# Patient Record
Sex: Female | Born: 1960 | Race: White | Hispanic: No | Marital: Married | Smoking: Current every day smoker
Health system: Southern US, Community
[De-identification: ages and names within clinical notes are randomized; demographics above are authoritative.]

## PROBLEM LIST (undated history)

## (undated) DIAGNOSIS — E785 Hyperlipidemia, unspecified: Secondary | ICD-10-CM

## (undated) DIAGNOSIS — E559 Vitamin D deficiency, unspecified: Secondary | ICD-10-CM

## (undated) DIAGNOSIS — I639 Cerebral infarction, unspecified: Secondary | ICD-10-CM

## (undated) DIAGNOSIS — T8859XA Other complications of anesthesia, initial encounter: Secondary | ICD-10-CM

## (undated) DIAGNOSIS — R112 Nausea with vomiting, unspecified: Secondary | ICD-10-CM

## (undated) DIAGNOSIS — Z9889 Other specified postprocedural states: Secondary | ICD-10-CM

## (undated) DIAGNOSIS — K219 Gastro-esophageal reflux disease without esophagitis: Secondary | ICD-10-CM

## (undated) DIAGNOSIS — I8390 Asymptomatic varicose veins of unspecified lower extremity: Secondary | ICD-10-CM

## (undated) DIAGNOSIS — J449 Chronic obstructive pulmonary disease, unspecified: Secondary | ICD-10-CM

## (undated) DIAGNOSIS — T4145XA Adverse effect of unspecified anesthetic, initial encounter: Secondary | ICD-10-CM

## (undated) DIAGNOSIS — I10 Essential (primary) hypertension: Secondary | ICD-10-CM

## (undated) DIAGNOSIS — R7303 Prediabetes: Secondary | ICD-10-CM

## (undated) DIAGNOSIS — R739 Hyperglycemia, unspecified: Secondary | ICD-10-CM

## (undated) DIAGNOSIS — F419 Anxiety disorder, unspecified: Secondary | ICD-10-CM

## (undated) HISTORY — DX: Cerebral infarction, unspecified: I63.9

## (undated) HISTORY — DX: Vitamin D deficiency, unspecified: E55.9

## (undated) HISTORY — DX: Essential (primary) hypertension: I10

## (undated) HISTORY — DX: Asymptomatic varicose veins of unspecified lower extremity: I83.90

## (undated) HISTORY — DX: Gastro-esophageal reflux disease without esophagitis: K21.9

## (undated) HISTORY — PX: CHOLECYSTECTOMY: SHX55

## (undated) HISTORY — DX: Hyperglycemia, unspecified: R73.9

## (undated) HISTORY — DX: Hyperlipidemia, unspecified: E78.5

---

## 2004-02-28 ENCOUNTER — Ambulatory Visit (HOSPITAL_COMMUNITY): Admission: RE | Admit: 2004-02-28 | Discharge: 2004-02-28 | Payer: Self-pay | Admitting: *Deleted

## 2005-08-26 ENCOUNTER — Ambulatory Visit (HOSPITAL_COMMUNITY): Admission: RE | Admit: 2005-08-26 | Discharge: 2005-08-26 | Payer: Self-pay | Admitting: *Deleted

## 2005-11-27 ENCOUNTER — Ambulatory Visit (HOSPITAL_COMMUNITY): Admission: RE | Admit: 2005-11-27 | Discharge: 2005-11-27 | Payer: Self-pay | Admitting: Family Medicine

## 2007-10-22 ENCOUNTER — Ambulatory Visit (HOSPITAL_COMMUNITY): Admission: RE | Admit: 2007-10-22 | Discharge: 2007-10-22 | Payer: Self-pay | Admitting: Family Medicine

## 2007-11-04 ENCOUNTER — Ambulatory Visit (HOSPITAL_COMMUNITY): Admission: RE | Admit: 2007-11-04 | Discharge: 2007-11-04 | Payer: Self-pay | Admitting: Family Medicine

## 2009-05-04 ENCOUNTER — Encounter: Admission: RE | Admit: 2009-05-04 | Discharge: 2009-05-04 | Payer: Self-pay | Admitting: Family Medicine

## 2009-06-28 ENCOUNTER — Ambulatory Visit (HOSPITAL_COMMUNITY): Admission: RE | Admit: 2009-06-28 | Discharge: 2009-06-28 | Payer: Self-pay | Admitting: Obstetrics & Gynecology

## 2009-07-27 ENCOUNTER — Ambulatory Visit (HOSPITAL_COMMUNITY): Admission: RE | Admit: 2009-07-27 | Discharge: 2009-07-27 | Payer: Self-pay | Admitting: General Surgery

## 2009-07-27 ENCOUNTER — Encounter (INDEPENDENT_AMBULATORY_CARE_PROVIDER_SITE_OTHER): Payer: Self-pay | Admitting: General Surgery

## 2010-12-22 ENCOUNTER — Encounter: Payer: Self-pay | Admitting: Family Medicine

## 2010-12-24 ENCOUNTER — Encounter: Payer: Self-pay | Admitting: Family Medicine

## 2011-02-07 ENCOUNTER — Other Ambulatory Visit (HOSPITAL_COMMUNITY): Payer: Self-pay | Admitting: Family Medicine

## 2011-02-07 DIAGNOSIS — Z139 Encounter for screening, unspecified: Secondary | ICD-10-CM

## 2011-02-15 ENCOUNTER — Ambulatory Visit (HOSPITAL_COMMUNITY)
Admission: RE | Admit: 2011-02-15 | Discharge: 2011-02-15 | Disposition: A | Discharge status: Home / Self Care | Payer: Self-pay | Source: Ambulatory Visit | Attending: Family Medicine | Admitting: Family Medicine

## 2011-02-15 DIAGNOSIS — Z139 Encounter for screening, unspecified: Secondary | ICD-10-CM

## 2011-03-09 LAB — BASIC METABOLIC PANEL
BUN: 11 mg/dL (ref 6–23)
CO2: 28 mEq/L (ref 19–32)
Chloride: 103 mEq/L (ref 96–112)
Glucose, Bld: 103 mg/dL — ABNORMAL HIGH (ref 70–99)
Potassium: 3.8 mEq/L (ref 3.5–5.1)

## 2011-03-09 LAB — CBC
HCT: 41.5 % (ref 36.0–46.0)
MCHC: 34.9 g/dL (ref 30.0–36.0)
MCV: 93.3 fL (ref 78.0–100.0)
Platelets: 232 10*3/uL (ref 150–400)
RDW: 12.8 % (ref 11.5–15.5)
WBC: 8.4 10*3/uL (ref 4.0–10.5)

## 2011-04-16 NOTE — Op Note (Signed)
NAMEJAIMEY, FRANCHINI              ACCOUNT NO.:  [id number]   MEDICAL RECORD NO.:  [id number]          PATIENT TYPE:  AMB   LOCATION:  SDS                          FACILITY:  [facility]   PHYSICIAN:  Lennie Muckle, MD      DATE OF BIRTH:  August 03, 1961   DATE OF PROCEDURE:  07/27/2009  DATE OF DISCHARGE:  07/27/2009                               OPERATIVE REPORT   PREOPERATIVE DIAGNOSIS:  Biliary colic.   POSTOPERATIVE DIAGNOSIS:  Biliary colic.   PROCEDURE:  Laparoscopic cholecystectomy.   SURGEON:  Amber L. Freida Busman, MD   ANESTHESIA:  General endotracheal anesthesia.   ASSISTANT:  OR staff.   FINDINGS:  Large stone within the gallbladder.   COMPLICATIONS:  No immediate complications.   DRAINS:  No drains were placed.   INDICATIONS FOR PROCEDURE:  Ms. Mattioli is a 50 year old female whom I  had seen for abdominal pain.  She mostly complained of lower abdominal  pain but had an epigastric component as well.  She had been having this  for approximately 8 months.  She had normal preoperative serum  chemistries with an AST of 24, ALT of 22, bilirubin was 1.5.  In  discussion with the patient, I felt she would likely have biliary colic.  Lower abdominal pain is likely contributed to fibroids.  I offered the  laparoscopic cholecystectomy.  Informed consent was obtained prior to  procedure.   DETAILS OF PROCEDURE:  Ms. Motz was identified in the preoperative  holding area.  She received 400 mg of gentamicin.  She was taken to the  operating room and placed in supine position.  After administration of  general endotracheal anesthesia, her abdomen was prepped and draped in  usual sterile fashion.  A time-out procedure indicating the patient and  procedure were performed.  SCDs were applied to her lower extremity.  I  placed an incision above the umbilicus, grasped the fascia with a  Kocher, and placed a Veress needle into abdominal cavity.  Fluid was  aspirated and was clear and went  easily into the abdomen.  After  insufflating the abdomen, I placed an 11-mm trocar using the Optiview.  All layers of abdominal wall were visualized upon entry.  I then  inspected the abdomen and found no evidence of injury upon placement of  trocar or the Veress needle.  I placed 3 trocars in the epigastric  region and the right upper quadrant under visualization with camera.  Gallbladder was identified in normal anatomic position.  There were  omental adhesions to the gallbladder.  I retracted the fundus to the  head of the patient, dissected the adhesions with a Maryland forceps.  They easily swept away.  I then grabbed the infundibulum, grasped this  away from the liver bed, dissected out the critical view of the cystic  duct and artery.  There was a large stone palpable near the infundibulum  of the gallbladder.  After obtaining a critical view, I placed 3 clips  proximally and 1 distally on the cystic duct and transected.  I also  clipped and divided the cystic artery.  The remaining peritoneal  attachments were divided with electrocautery.  The gallbladder was  placed into an EndoCatch bag.  There was a small amount of spillage  during the procedure.  I irrigated the abdomen with a liter of saline.  The irrigation was clear.  The liver bed was dry.  There was no leakage  of biliary contents.  There was no bile leakage.  The gallbladder was  removed from the umbilical incision.  I then closed the fascia with a 0  Vicryl suture.  I then re-inspected the liver bed and found no evidence  of bleeding.  No bile leakage and no evidence of intra-abdominal injury.  I then released pneumoperitoneum and removed the trocar.  Skin was  closed with 4-0 Monocryl.  Dermabond was placed for final dressing.  The  patient was extubated and transported to postanesthesia care unit in  stable condition.  Gallstone was inspected, it was approximately 3 cm in  size.   The patient will be discharged home  with Percocet.  Follow up with me in  approximately 2-3 weeks.  No heavy lifting greater than 20 pounds.      Lennie Muckle, MD  Electronically Signed     ALA/MEDQ  D:  07/27/2009  T:  07/28/2009  Job:  [id number]   cc:   [facility]

## 2012-02-03 ENCOUNTER — Other Ambulatory Visit (HOSPITAL_COMMUNITY): Payer: Self-pay | Admitting: Family Medicine

## 2012-02-03 DIAGNOSIS — Z139 Encounter for screening, unspecified: Secondary | ICD-10-CM

## 2012-02-20 ENCOUNTER — Ambulatory Visit (HOSPITAL_COMMUNITY): Payer: Self-pay

## 2012-02-24 ENCOUNTER — Ambulatory Visit (HOSPITAL_COMMUNITY)
Admission: RE | Admit: 2012-02-24 | Discharge: 2012-02-24 | Disposition: A | Discharge status: Home / Self Care | Payer: BC Managed Care – PPO | Source: Ambulatory Visit | Attending: Family Medicine | Admitting: Family Medicine

## 2012-02-24 ENCOUNTER — Ambulatory Visit (HOSPITAL_COMMUNITY): Payer: Self-pay

## 2012-02-24 DIAGNOSIS — Z139 Encounter for screening, unspecified: Secondary | ICD-10-CM

## 2012-02-24 DIAGNOSIS — Z1231 Encounter for screening mammogram for malignant neoplasm of breast: Secondary | ICD-10-CM | POA: Insufficient documentation

## 2013-04-19 ENCOUNTER — Other Ambulatory Visit (HOSPITAL_COMMUNITY): Payer: Self-pay | Admitting: Family Medicine

## 2013-04-19 DIAGNOSIS — Z139 Encounter for screening, unspecified: Secondary | ICD-10-CM

## 2013-05-10 ENCOUNTER — Ambulatory Visit (HOSPITAL_COMMUNITY): Payer: BC Managed Care – PPO

## 2013-05-17 ENCOUNTER — Ambulatory Visit (HOSPITAL_COMMUNITY): Payer: BC Managed Care – PPO

## 2013-06-08 ENCOUNTER — Ambulatory Visit (HOSPITAL_COMMUNITY): Payer: BC Managed Care – PPO

## 2013-06-14 ENCOUNTER — Ambulatory Visit (HOSPITAL_COMMUNITY): Payer: BC Managed Care – PPO

## 2013-07-26 ENCOUNTER — Ambulatory Visit (HOSPITAL_COMMUNITY)
Admission: RE | Admit: 2013-07-26 | Discharge: 2013-07-26 | Disposition: A | Discharge status: Home / Self Care | Payer: BC Managed Care – PPO | Source: Ambulatory Visit | Attending: Family Medicine | Admitting: Family Medicine

## 2013-07-26 DIAGNOSIS — Z1231 Encounter for screening mammogram for malignant neoplasm of breast: Secondary | ICD-10-CM | POA: Insufficient documentation

## 2013-07-26 DIAGNOSIS — Z139 Encounter for screening, unspecified: Secondary | ICD-10-CM

## 2014-07-14 ENCOUNTER — Other Ambulatory Visit (HOSPITAL_COMMUNITY): Payer: Self-pay | Admitting: Family Medicine

## 2014-07-14 DIAGNOSIS — Z1231 Encounter for screening mammogram for malignant neoplasm of breast: Secondary | ICD-10-CM

## 2014-08-01 ENCOUNTER — Ambulatory Visit (HOSPITAL_COMMUNITY)
Admission: RE | Admit: 2014-08-01 | Discharge: 2014-08-01 | Disposition: A | Discharge status: Home / Self Care | Payer: BC Managed Care – PPO | Source: Ambulatory Visit | Attending: Family Medicine | Admitting: Family Medicine

## 2014-08-01 ENCOUNTER — Ambulatory Visit (HOSPITAL_COMMUNITY): Payer: BC Managed Care – PPO

## 2014-08-01 DIAGNOSIS — Z1231 Encounter for screening mammogram for malignant neoplasm of breast: Secondary | ICD-10-CM | POA: Diagnosis not present

## 2014-11-18 ENCOUNTER — Encounter: Payer: Self-pay | Admitting: Vascular Surgery

## 2014-11-18 ENCOUNTER — Other Ambulatory Visit: Payer: Self-pay

## 2014-11-18 DIAGNOSIS — I8390 Asymptomatic varicose veins of unspecified lower extremity: Secondary | ICD-10-CM

## 2015-01-05 ENCOUNTER — Encounter (HOSPITAL_COMMUNITY): Payer: BC Managed Care – PPO

## 2015-01-05 ENCOUNTER — Encounter: Payer: BC Managed Care – PPO | Admitting: Vascular Surgery

## 2015-02-08 ENCOUNTER — Encounter: Payer: Self-pay | Admitting: Vascular Surgery

## 2015-02-09 ENCOUNTER — Encounter: Payer: Self-pay | Admitting: Vascular Surgery

## 2015-02-09 ENCOUNTER — Ambulatory Visit (HOSPITAL_COMMUNITY)
Admission: RE | Admit: 2015-02-09 | Discharge: 2015-02-09 | Disposition: A | Discharge status: Home / Self Care | Payer: BLUE CROSS/BLUE SHIELD | Source: Ambulatory Visit | Attending: Vascular Surgery | Admitting: Vascular Surgery

## 2015-02-09 ENCOUNTER — Ambulatory Visit (INDEPENDENT_AMBULATORY_CARE_PROVIDER_SITE_OTHER): Payer: BLUE CROSS/BLUE SHIELD | Admitting: Vascular Surgery

## 2015-02-09 VITALS — BP 137/94 | HR 83 | Wt 202.6 lb

## 2015-02-09 DIAGNOSIS — I8390 Asymptomatic varicose veins of unspecified lower extremity: Secondary | ICD-10-CM

## 2015-02-09 DIAGNOSIS — I83893 Varicose veins of bilateral lower extremities with other complications: Secondary | ICD-10-CM | POA: Diagnosis present

## 2015-02-09 DIAGNOSIS — I868 Varicose veins of other specified sites: Secondary | ICD-10-CM

## 2015-02-09 NOTE — Progress Notes (Signed)
[facility] [place] HISTORY AND PHYSICAL   History of Present Illness:  Patient is a 54 y.o. year old female who presents for evaluation of symptomatic varicose veins. The patient has an area at her left ankle that itches frequently. She also has a burning sensation in his leg as well. She has had 2 prior bleeding episodes from ruptured varicose veins in the lower aspect of this leg. Both of these episodes were 4-5 months ago.  She has not worn compression stockings in the past. She does have a family history of varicose veins in her mother and father. She currently smokes half a pack of cigarettes per day but is trying to quit. She also has a history of hypertension elevated cholesterol and is considered prediabetic. These problems are all currently stable. She denies claudication symptoms. She denies rest pain. She denies nonhealing wounds on her feet or skin.  She denies prior history of DVT. She does develop heaviness and aching in her legs as the day progresses. This recovers after a night of sleep.  Past Medical History  Diagnosis Date  . Hypertension   . Hyperglycemia   . Hyperlipidemia   . GERD (gastroesophageal reflux disease)   . Varicose veins   . Vitamin D deficiency   . Stroke     Past Surgical History  Procedure Laterality Date  . Cholecystectomy      Social History History  Substance Use Topics  . Smoking status: Current Every Day Smoker -- 0.50 packs/day for 30 years    Types: Cigarettes  . Smokeless tobacco: Not on file  . Alcohol Use: No    Family History Family History  Problem Relation Age of Onset  . Heart disease Mother   . Varicose Veins Mother   . Bleeding Disorder Mother   . Heart disease Father     before age 25  . Hyperlipidemia Father   . Hypertension Father   . Heart attack Father   . Heart disease Sister   . Hypertension Sister   . Diabetes Brother   . Hypertension Brother     Allergies  Allergies  Allergen  Reactions  . Avelox [Moxifloxacin Hcl In Nacl]   . Penicillins   . Vancomycin      Current Outpatient Prescriptions  Medication Sig Dispense Refill  . atorvastatin (LIPITOR) 20 MG tablet Take 20 mg by mouth daily.    . fluticasone (FLONASE) 50 MCG/ACT nasal spray Place 2 sprays into both nostrils daily.    . hydrochlorothiazide (HYDRODIURIL) 25 MG tablet Take 25 mg by mouth daily.    Marland Kitchen LORazepam (ATIVAN) 0.5 MG tablet Take 0.5 mg by mouth every 8 (eight) hours.    . meloxicam (MOBIC) 15 MG tablet Take 15 mg by mouth daily.    Marland Kitchen omeprazole (PRILOSEC) 40 MG capsule Take 40 mg by mouth daily.    Marland Kitchen terbinafine (LAMISIL) 250 MG tablet Take 250 mg by mouth daily.    Marland Kitchen triamcinolone ointment (KENALOG) 0.1 % Apply 1 application topically 3 (three) times daily.     No current facility-administered medications for this visit.    ROS:   General:  No weight loss, Fever, chills  HEENT: No recent headaches, no nasal bleeding, no visual changes, no sore throat  Neurologic: No dizziness, blackouts, seizures. No recent symptoms of stroke or mini- stroke. No recent episodes of slurred speech, or temporary blindness.  Cardiac: No recent episodes of chest pain/pressure, no shortness of breath at rest.  No  shortness of breath with exertion.  Denies history of atrial fibrillation or irregular heartbeat  Vascular: No history of rest pain in feet.  No history of claudication.  No history of non-healing ulcer, No history of DVT   Pulmonary: No home oxygen, no productive cough, no hemoptysis,  No asthma or wheezing  Musculoskeletal:  [ ]  Arthritis, [ ]  Low back pain,  [ ]  Joint pain  Hematologic:No history of hypercoagulable state.  No history of easy bleeding.  No history of anemia  Gastrointestinal: No hematochezia or melena,  No gastroesophageal reflux, no trouble swallowing  Urinary: [ ]  chronic Kidney disease, [ ]  on HD - [ ]  MWF or [ ]  TTHS, [ ]  Burning with urination, [ ]  Frequent urination, [  ] Difficulty urinating;   Skin: No rashes  Psychological: No history of anxiety,  No history of depression   Physical Examination  Filed Vitals:   02/09/15 1158  BP: 137/94  Pulse: 83  Weight: 202 lb 9.6 oz (91.899 kg)  SpO2: 98%    There is no height on file to calculate BMI.  General:  Alert and oriented, no acute distress HEENT: Normal Neck: No bruit or JVD Pulmonary: Clear to auscultation bilaterally Cardiac: Regular Rate and Rhythm without murmur Abdomen: Soft, non-tender, non-distended, no mass, obese Skin: No rash, right posterior calf above and below the knee with clusters of 4-6 mm varicosities. Both legs have large surface areas of reticular type and spider-type veins posteriorly from the knee down. She also has some of these anteriorly on the right leg. Extremity Pulses:  2+ radial, brachial, femoral, 2+ left dorsalis pedis, absent right dorsalis pedis 2+ posterior tibial pulses bilaterally Musculoskeletal: No deformity or edema  Neurologic: Upper and lower extremity motor 5/5 and symmetric  DATA:  Patient had a venous duplex exam today which I reviewed and interpreted. This showed bilateral common femoral superficial femoral and popliteal vein deep vein reflux. She also had reflux at the right saphenofemoral junction and diffusely throughout the left greater saphenous. Saphenous diameter on the right side was 5-8 mm. Left side was 7-9 mm.   ASSESSMENT:  Patient with bilateral symptomatically varicose veins with several bleeding episodes from the left leg recently. Pathophysiology of superficial and deep vein reflux was discussed with the patient today. Weight loss was also recommended with the patient today. She was given a prescription today for bilateral lower extremity compression stockings for symptomatic relief.   PLAN:  She will follow-up in 3 months for consideration of laser ablation possible sclerotherapy and stab avulsions.  Ruta Hinds, MD [facility] [place] Office: [phone] Pager: [phone]

## 2015-05-16 ENCOUNTER — Ambulatory Visit: Payer: BLUE CROSS/BLUE SHIELD | Admitting: Vascular Surgery

## 2015-06-07 ENCOUNTER — Other Ambulatory Visit: Payer: Self-pay | Admitting: Family Medicine

## 2015-06-07 DIAGNOSIS — R221 Localized swelling, mass and lump, neck: Secondary | ICD-10-CM

## 2015-06-08 ENCOUNTER — Other Ambulatory Visit (HOSPITAL_COMMUNITY): Payer: Self-pay | Admitting: Family Medicine

## 2015-06-08 DIAGNOSIS — R221 Localized swelling, mass and lump, neck: Secondary | ICD-10-CM

## 2015-06-09 ENCOUNTER — Other Ambulatory Visit: Payer: BLUE CROSS/BLUE SHIELD

## 2015-06-13 ENCOUNTER — Ambulatory Visit (HOSPITAL_COMMUNITY)
Admission: RE | Admit: 2015-06-13 | Discharge: 2015-06-13 | Disposition: A | Discharge status: Home / Self Care | Payer: BLUE CROSS/BLUE SHIELD | Source: Ambulatory Visit | Attending: Family Medicine | Admitting: Family Medicine

## 2015-06-13 DIAGNOSIS — R221 Localized swelling, mass and lump, neck: Secondary | ICD-10-CM | POA: Diagnosis present

## 2015-06-20 ENCOUNTER — Ambulatory Visit: Payer: BLUE CROSS/BLUE SHIELD | Admitting: Vascular Surgery

## 2015-08-03 ENCOUNTER — Encounter (INDEPENDENT_AMBULATORY_CARE_PROVIDER_SITE_OTHER): Payer: Self-pay | Admitting: *Deleted

## 2015-09-12 ENCOUNTER — Ambulatory Visit (INDEPENDENT_AMBULATORY_CARE_PROVIDER_SITE_OTHER): Payer: BLUE CROSS/BLUE SHIELD | Admitting: Internal Medicine

## 2015-10-05 ENCOUNTER — Other Ambulatory Visit (HOSPITAL_COMMUNITY): Payer: Self-pay | Admitting: Family Medicine

## 2015-10-05 DIAGNOSIS — Z1231 Encounter for screening mammogram for malignant neoplasm of breast: Secondary | ICD-10-CM

## 2015-10-16 ENCOUNTER — Ambulatory Visit (HOSPITAL_COMMUNITY): Payer: BLUE CROSS/BLUE SHIELD

## 2015-10-19 ENCOUNTER — Ambulatory Visit (HOSPITAL_COMMUNITY)
Admission: RE | Admit: 2015-10-19 | Discharge: 2015-10-19 | Disposition: A | Discharge status: Home / Self Care | Payer: BLUE CROSS/BLUE SHIELD | Source: Ambulatory Visit | Attending: Family Medicine | Admitting: Family Medicine

## 2015-10-19 DIAGNOSIS — Z1231 Encounter for screening mammogram for malignant neoplasm of breast: Secondary | ICD-10-CM | POA: Insufficient documentation

## 2015-11-03 ENCOUNTER — Encounter (INDEPENDENT_AMBULATORY_CARE_PROVIDER_SITE_OTHER): Payer: Self-pay | Admitting: *Deleted

## 2015-12-21 ENCOUNTER — Encounter (INDEPENDENT_AMBULATORY_CARE_PROVIDER_SITE_OTHER): Payer: Self-pay | Admitting: Internal Medicine

## 2015-12-21 ENCOUNTER — Ambulatory Visit (INDEPENDENT_AMBULATORY_CARE_PROVIDER_SITE_OTHER): Payer: BLUE CROSS/BLUE SHIELD | Admitting: Internal Medicine

## 2016-02-16 ENCOUNTER — Ambulatory Visit: Payer: BLUE CROSS/BLUE SHIELD | Admitting: Podiatry

## 2016-02-22 ENCOUNTER — Ambulatory Visit: Payer: BLUE CROSS/BLUE SHIELD | Admitting: Podiatry

## 2016-02-26 ENCOUNTER — Ambulatory Visit (INDEPENDENT_AMBULATORY_CARE_PROVIDER_SITE_OTHER): Payer: BLUE CROSS/BLUE SHIELD

## 2016-02-26 ENCOUNTER — Ambulatory Visit (INDEPENDENT_AMBULATORY_CARE_PROVIDER_SITE_OTHER): Payer: BLUE CROSS/BLUE SHIELD | Admitting: Podiatry

## 2016-02-26 DIAGNOSIS — M21619 Bunion of unspecified foot: Secondary | ICD-10-CM | POA: Diagnosis not present

## 2016-02-26 DIAGNOSIS — M2041 Other hammer toe(s) (acquired), right foot: Secondary | ICD-10-CM | POA: Diagnosis not present

## 2016-02-26 DIAGNOSIS — M722 Plantar fascial fibromatosis: Secondary | ICD-10-CM

## 2016-02-26 MED ORDER — DICLOFENAC SODIUM 75 MG PO TBEC: 75.0000 mg | DELAYED_RELEASE_TABLET | Freq: Two times a day (BID) | ORAL | Status: DC

## 2016-02-26 MED ORDER — TRIAMCINOLONE ACETONIDE 10 MG/ML IJ SUSP
10.0000 mg | Freq: Once | INTRAMUSCULAR | Status: AC
  Administered 2016-02-26: 10 mg

## 2016-02-26 NOTE — Patient Instructions (Addendum)
Plantar Fasciitis (Heel Spur Syndrome) with Rehab The plantar fascia is a fibrous, ligament-like, soft-tissue structure that spans the bottom of the foot. Plantar fasciitis is a condition that causes pain in the foot due to inflammation of the tissue. SYMPTOMS   Pain and tenderness on the underneath side of the foot.  Pain that worsens with standing or walking. CAUSES  Plantar fasciitis is caused by irritation and injury to the plantar fascia on the underneath side of the foot. Common mechanisms of injury include:  Direct trauma to bottom of the foot.  Damage to a small nerve that runs under the foot where the main fascia attaches to the heel bone.  Stress placed on the plantar fascia due to bone spurs. RISK INCREASES WITH:   Activities that place stress on the plantar fascia (running, jumping, pivoting, or cutting).  Poor strength and flexibility.  Improperly fitted shoes.  Tight calf muscles.  Flat feet.  Failure to warm-up properly before activity.  Obesity. PREVENTION  Warm up and stretch properly before activity.  Allow for adequate recovery between workouts.  Maintain physical fitness:  Strength, flexibility, and endurance.  Cardiovascular fitness.  Maintain a health body weight.  Avoid stress on the plantar fascia.  Wear properly fitted shoes, including arch supports for individuals who have flat feet.  PROGNOSIS  If treated properly, then the symptoms of plantar fasciitis usually resolve without surgery. However, occasionally surgery is necessary.  RELATED COMPLICATIONS   Recurrent symptoms that may result in a chronic condition.  Problems of the lower back that are caused by compensating for the injury, such as limping.  Pain or weakness of the foot during push-off following surgery.  Chronic inflammation, scarring, and partial or complete fascia tear, occurring more often from repeated injections.  TREATMENT  Treatment initially involves the  use of ice and medication to help reduce pain and inflammation. The use of strengthening and stretching exercises may help reduce pain with activity, especially stretches of the Achilles tendon. These exercises may be performed at home or with a therapist. Your caregiver may recommend that you use heel cups of arch supports to help reduce stress on the plantar fascia. Occasionally, corticosteroid injections are given to reduce inflammation. If symptoms persist for greater than 6 months despite non-surgical (conservative), then surgery may be recommended.   MEDICATION   If pain medication is necessary, then nonsteroidal anti-inflammatory medications, such as aspirin and ibuprofen, or other minor pain relievers, such as acetaminophen, are often recommended.  Do not take pain medication within 7 days before surgery.  Prescription pain relievers may be given if deemed necessary by your caregiver. Use only as directed and only as much as you need.  Corticosteroid injections may be given by your caregiver. These injections should be reserved for the most serious cases, because they may only be given a certain number of times.  HEAT AND COLD  Cold treatment (icing) relieves pain and reduces inflammation. Cold treatment should be applied for 10 to 15 minutes every 2 to 3 hours for inflammation and pain and immediately after any activity that aggravates your symptoms. Use ice packs or massage the area with a piece of ice (ice massage).  Heat treatment may be used prior to performing the stretching and strengthening activities prescribed by your caregiver, physical therapist, or athletic trainer. Use a heat pack or soak the injury in warm water.  SEEK IMMEDIATE MEDICAL CARE IF:  Treatment seems to offer no benefit, or the condition worsens.  Any medications   produce adverse side effects.  EXERCISES- RANGE OF MOTION (ROM) AND STRETCHING EXERCISES - Plantar Fasciitis (Heel Spur Syndrome) These exercises  may help you when beginning to rehabilitate your injury. Your symptoms may resolve with or without further involvement from your physician, physical therapist or athletic trainer. While completing these exercises, remember:   Restoring tissue flexibility helps normal motion to return to the joints. This allows healthier, less painful movement and activity.  An effective stretch should be held for at least 30 seconds.  A stretch should never be painful. You should only feel a gentle lengthening or release in the stretched tissue.  RANGE OF MOTION - Toe Extension, Flexion  Sit with your right / left leg crossed over your opposite knee.  Grasp your toes and gently pull them back toward the top of your foot. You should feel a stretch on the bottom of your toes and/or foot.  Hold this stretch for 10 seconds.  Now, gently pull your toes toward the bottom of your foot. You should feel a stretch on the top of your toes and or foot.  Hold this stretch for 10 seconds. Repeat  times. Complete this stretch 3 times per day.   RANGE OF MOTION - Ankle Dorsiflexion, Active Assisted  Remove shoes and sit on a chair that is preferably not on a carpeted surface.  Place right / left foot under knee. Extend your opposite leg for support.  Keeping your heel down, slide your right / left foot back toward the chair until you feel a stretch at your ankle or calf. If you do not feel a stretch, slide your bottom forward to the edge of the chair, while still keeping your heel down.  Hold this stretch for 10 seconds. Repeat 3 times. Complete this stretch 2 times per day.   STRETCH  Gastroc, Standing  Place hands on wall.  Extend right / left leg, keeping the front knee somewhat bent.  Slightly point your toes inward on your back foot.  Keeping your right / left heel on the floor and your knee straight, shift your weight toward the wall, not allowing your back to arch.  You should feel a gentle stretch  in the right / left calf. Hold this position for 10 seconds. Repeat 3 times. Complete this stretch 2 times per day.  STRETCH  Soleus, Standing  Place hands on wall.  Extend right / left leg, keeping the other knee somewhat bent.  Slightly point your toes inward on your back foot.  Keep your right / left heel on the floor, bend your back knee, and slightly shift your weight over the back leg so that you feel a gentle stretch deep in your back calf.  Hold this position for 10 seconds. Repeat 3 times. Complete this stretch 2 times per day.  STRETCH  Gastrocsoleus, Standing  Note: This exercise can place a lot of stress on your foot and ankle. Please complete this exercise only if specifically instructed by your caregiver.   Place the ball of your right / left foot on a step, keeping your other foot firmly on the same step.  Hold on to the wall or a rail for balance.  Slowly lift your other foot, allowing your body weight to press your heel down over the edge of the step.  You should feel a stretch in your right / left calf.  Hold this position for 10 seconds.  Repeat this exercise with a slight bend in your right /   left knee. Repeat 3 times. Complete this stretch 2 times per day.   STRENGTHENING EXERCISES - Plantar Fasciitis (Heel Spur Syndrome)  These exercises may help you when beginning to rehabilitate your injury. They may resolve your symptoms with or without further involvement from your physician, physical therapist or athletic trainer. While completing these exercises, remember:   Muscles can gain both the endurance and the strength needed for everyday activities through controlled exercises.  Complete these exercises as instructed by your physician, physical therapist or athletic trainer. Progress the resistance and repetitions only as guided.  STRENGTH - Towel Curls  Sit in a chair positioned on a non-carpeted surface.  Place your foot on a towel, keeping your heel  on the floor.  Pull the towel toward your heel by only curling your toes. Keep your heel on the floor. Repeat 3 times. Complete this exercise 2 times per day.  STRENGTH - Ankle Inversion  Secure one end of a rubber exercise band/tubing to a fixed object (table, pole). Loop the other end around your foot just before your toes.  Place your fists between your knees. This will focus your strengthening at your ankle.  Slowly, pull your big toe up and in, making sure the band/tubing is positioned to resist the entire motion.  Hold this position for 10 seconds.  Have your muscles resist the band/tubing as it slowly pulls your foot back to the starting position. Repeat 3 times. Complete this exercises 2 times per day.  Document Released: 11/18/2005 Document Revised: 02/10/2012 Document Reviewed: 03/02/2009 [facility] Patient Information 2014 [place], [place].Bunion (Hallux Valgus) A bony bump (protrusion) on the inside of the foot, at the base of the first toe, is called a bunion (hallux valgus). A bunion causes the first toe to angle toward the other toes. SYMPTOMS   A bony bump on the inside of the foot, causing an outward turning of the first toe. It may also overlap the second toe.  Thickening of the skin (callus) over the bony bump.  Fluid buildup under the callus. Fluid may become red, tender, and swollen (inflamed) with constant irritation or pressure.  Foot pain and stiffness. CAUSES  Many causes exist, including:  Inherited from your family (genetics).  Injury (trauma) forcing the first toe into a position in which it overlaps other toes.  Bunions are also associated with wearing shoes that have a narrow toe box (pointy shoes). RISK INCREASES WITH:  Family history of foot abnormalities, especially bunions.  Arthritis.  Narrow shoes, especially high heels. PREVENTION  Wear shoes with a wide toe box.  Avoid shoes with high heels.  Wear a small pad between the big toe and  second toe.  Maintain proper conditioning:  Foot and ankle flexibility.  Muscle strength and endurance. PROGNOSIS  With proper treatment, bunions can typically be cured. Occasionally, surgery is required.  RELATED COMPLICATIONS   Infection of the bunion.  Arthritis of the first toe.  Risks of surgery, including infection, bleeding, injury to nerves (numb toe), recurrent bunion, overcorrection (toe points inward), arthritis of the big toe, big toe pointing upward, and bone not healing. TREATMENT  Treatment first consists of stopping the activities that aggravate the pain, taking pain medicines, and icing to reduce inflammation and pain. Wear shoes with a wide toe box. Shoes can be modified by a shoe repair person to relieve pressure on the bunion, especially if you cannot find shoes with a wide enough toe box. You may also place a pad with the center  cut out in your shoe, to reduce pressure on the bunion. Sometimes, an arch support (orthotic) may reduce pressure on the bunion and alleviate the symptoms. Stretching and strengthening exercises for the muscles of the foot may be useful. You may choose to wear a brace or pad at night to hold the big toe away from the second toe. If non-surgical treatments are not successful, surgery may be needed. Surgery involves removing the overgrown tissue and correcting the position of the first toe, by realigning the bones. Bunion surgery is typically performed on an outpatient basis, meaning you can go home the same day as surgery. The surgery may involve cutting the mid portion of the bone of the first toe, or just cutting and repairing (reconstructing) the ligaments and soft tissues around the first toe.  MEDICATION   If pain medicine is needed, nonsteroidal anti-inflammatory medicines, such as aspirin and ibuprofen, or other minor pain relievers, such as acetaminophen, are often recommended.  Do not take pain medicine for 7 days before  surgery.  Prescription pain relievers are usually only prescribed after surgery. Use only as directed and only as much as you need.  Ointments applied to the skin may be helpful. HEAT AND COLD  Cold treatment (icing) relieves pain and reduces inflammation. Cold treatment should be applied for 10 to 15 minutes every 2 to 3 hours for inflammation and pain and immediately after any activity that aggravates your symptoms. Use ice packs or an ice massage.  Heat treatment may be used prior to performing the stretching and strengthening activities prescribed by your caregiver, physical therapist, or athletic trainer. Use a heat pack or a warm soak. SEEK MEDICAL CARE IF:   Symptoms get worse or do not improve in 2 weeks, despite treatment.  After surgery, you develop fever, increasing pain, redness, swelling, drainage of fluids, bleeding, or increasing warmth around the surgical area.  New, unexplained symptoms develop. (Drugs used in treatment may produce side effects.)   This information is not intended to replace advice given to you by your health care provider. Make sure you discuss any questions you have with your health care provider.   Document Released: 11/18/2005 Document Revised: 02/10/2012 Document Reviewed: 03/02/2009 Elsevier Interactive Patient Education [employer].

## 2016-02-26 NOTE — Progress Notes (Signed)
   Subjective:    Patient ID: Tracey Luna, female    DOB: Mar 27, 1961, 55 y.o.   MRN: [id number]  HPI  Pt presents with right foot pain in heel and bunion, heel pain lasting 2 weeks and worse in the mornings after ambulation. Left foot pain across dorsal side with h/o stress fx 10 years prior  Review of Systems  All other systems reviewed and are negative.      Objective:   Physical Exam        Assessment & Plan:

## 2016-02-27 NOTE — Progress Notes (Signed)
Subjective:     Patient ID: Tracey Luna, female   DOB: [birth year], 55 y.o.   MRN: [id number]  HPI patient presents with significant structural bunion deformity right elevated rigid hammertoe deformity second digit right and exquisite tenderness in the right heel of several months duration. Stating it's gotten worse over the last couple weeks. Patient is had trouble with the bunion hammertoe for the last several years and is tried wider shoes and padding and soaks without relief   Review of Systems  All other systems reviewed and are negative.      Objective:   Physical Exam  Constitutional: She is oriented to person, place, and time.  Cardiovascular: Intact distal pulses.   Musculoskeletal: Normal range of motion.  Neurological: She is oriented to person, place, and time.  Skin: Skin is warm.  Nursing note and vitals reviewed.  neurovascular status intact muscle strength adequate range of motion within normal limits with patient found to have pain in the right plantar heel insertional point tendon into the calcaneus with fluid buildup and moderate depression of the arch noted. Patient has large hyperostosis medial aspect first metatarsal head right that's painful and red when pressed and elevated second toe right with rigid contracture of the digit noted. Patient is found to have good digital perfusion and is well oriented 3     Assessment:     Plantar fasciitis right with structural bunion deformity right and rigid hammertoe deformity second digit right    Plan:     H&P and x-rays reviewed with patient. Injected the right plantar fascia 3 mg Kenalog 5 mg Xylocaine and applied fascial brace with instructions on usage. Patient then had discussion with me concerning bunion and hammertoe and wants to get them fixed and I do think aggressive distal osteotomy with digital fusion would be of great benefit to her. Patient wants this procedure but wants to first get the heel better area I  did go ahead and dispensed fascial brace with instructions on usage  X-ray report indicates large elevation of the intermetatarsal angle between the first and second metatarsal of 16 and deviation the hallux against second toe with rigid contracture digit 2 right. Small plantar spur noted

## 2016-03-11 ENCOUNTER — Encounter: Payer: Self-pay | Admitting: Podiatry

## 2016-03-11 ENCOUNTER — Ambulatory Visit (INDEPENDENT_AMBULATORY_CARE_PROVIDER_SITE_OTHER): Payer: BLUE CROSS/BLUE SHIELD | Admitting: Podiatry

## 2016-03-11 DIAGNOSIS — M2011 Hallux valgus (acquired), right foot: Secondary | ICD-10-CM | POA: Diagnosis not present

## 2016-03-11 DIAGNOSIS — M2041 Other hammer toe(s) (acquired), right foot: Secondary | ICD-10-CM

## 2016-03-11 DIAGNOSIS — M722 Plantar fascial fibromatosis: Secondary | ICD-10-CM

## 2016-03-11 MED ORDER — TRIAMCINOLONE ACETONIDE 10 MG/ML IJ SUSP
10.0000 mg | Freq: Once | INTRAMUSCULAR | Status: AC
  Administered 2016-03-11: 10 mg

## 2016-03-12 NOTE — Progress Notes (Signed)
Subjective:     Patient ID: Tracey Luna, female   DOB: Apr 05, 1961, 55 y.o.   MRN: [id number]  HPI patient states I'm doing pretty well but I know on getting need surgery on my right foot and I'm having pain on my left foot now around the tendon. States the right heel is improved   Review of Systems     Objective:   Physical Exam Neurovascular status intact muscle strength was adequate with patient found to have significant reduction discomfort plantar heel right. Patient has significant structural bunion deformity elevated second toe with rigid contracture and redness on top of the toe and around the big toe joint and also now has pain forefoot left around the anterior tibial insertion of the left tendon    Assessment:     HAV deformity right hammertoe deformity with also moderate hallux or phalanges deformity along with improving plantar fasciitis right and tendinitis of the anterior tibial left    Plan:     H&P and x-rays reviewed with patient. At this point I do think that long-term this is can require some form of distal osteotomy with digital fusion possible Akin osteotomy and I reviewed procedures condition she wants to get this done but needs to look at her schedule and I explained the postoperative period. The left foot I did a careful sheath injection in future tib 3 mg Dexon some Kenalog 5 mill grams Xylocaine and explained risk of procedure. Patient be seen back to recheck

## 2016-03-20 ENCOUNTER — Telehealth: Payer: Self-pay | Admitting: *Deleted

## 2016-03-20 NOTE — Telephone Encounter (Signed)
"  I want to know how long I'd be out of work for my surgery.  What is he going to be doing?  All he mentioned was that I needed surgery, no details were given."  If you have a sit down job, you can go back after a week.  If you stand you could be out for 6-8 weeks.  You're having an [employer], possible [place] Osteotomy and Hammer toe Repair 2nd w/ pin and distal 4th digit.  "That's not what he told me, he said I would be good after 2 weeks.  I can't just sit on my job.  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  How much will I owe out of pocket?"  I'll have to get Jocelyn Lamer, insurance/billing, to call you back with an estimate.

## 2016-05-13 ENCOUNTER — Ambulatory Visit (INDEPENDENT_AMBULATORY_CARE_PROVIDER_SITE_OTHER): Payer: BLUE CROSS/BLUE SHIELD | Admitting: Otolaryngology

## 2016-05-13 DIAGNOSIS — D3709 Neoplasm of uncertain behavior of other specified sites of the oral cavity: Secondary | ICD-10-CM | POA: Diagnosis not present

## 2016-05-13 DIAGNOSIS — R040 Epistaxis: Secondary | ICD-10-CM | POA: Diagnosis not present

## 2016-05-20 ENCOUNTER — Ambulatory Visit (INDEPENDENT_AMBULATORY_CARE_PROVIDER_SITE_OTHER): Payer: BLUE CROSS/BLUE SHIELD | Admitting: Otolaryngology

## 2016-05-20 DIAGNOSIS — R040 Epistaxis: Secondary | ICD-10-CM

## 2016-05-30 ENCOUNTER — Encounter: Payer: Self-pay | Admitting: Podiatry

## 2016-05-30 ENCOUNTER — Ambulatory Visit (INDEPENDENT_AMBULATORY_CARE_PROVIDER_SITE_OTHER): Payer: BLUE CROSS/BLUE SHIELD | Admitting: Podiatry

## 2016-05-30 DIAGNOSIS — M2041 Other hammer toe(s) (acquired), right foot: Secondary | ICD-10-CM

## 2016-05-30 DIAGNOSIS — M722 Plantar fascial fibromatosis: Secondary | ICD-10-CM

## 2016-05-30 DIAGNOSIS — M2011 Hallux valgus (acquired), right foot: Secondary | ICD-10-CM | POA: Diagnosis not present

## 2016-05-30 MED ORDER — TRIAMCINOLONE ACETONIDE 10 MG/ML IJ SUSP
10.0000 mg | Freq: Once | INTRAMUSCULAR | Status: AC
  Administered 2016-05-30: 10 mg

## 2016-05-30 NOTE — Progress Notes (Signed)
Subjective:     Patient ID: Tracey Luna, female   DOB: 22-Jun-1961, 55 y.o.   MRN: [id number]  HPI patient presents stating I'm getting quite a bit of discomfort still in my heel and I know I'll need to get the bunion and the toe fixed at one point   Review of Systems     Objective:   Physical Exam Neurovascular status intact muscle strength adequate with continued discomfort plantar heel and structural bunion deformity noted right along with hammertoe    Assessment:     Continue plantar fascial symptomatology right with structural bunion hammertoe deformity also noted    Plan:     Reviewed conditions and discussed treatment options and today I injected the plantar heel right 3 Milligan Kenalog 5 mill grams Xylocaine and dispensed night splint with all instructions on usage. Ultimately surgery will probably be necessary but we want to continue to treat conservatively and we will reevaluate again in the next 4 weeks

## 2016-06-03 ENCOUNTER — Telehealth: Payer: Self-pay

## 2016-06-03 NOTE — Telephone Encounter (Signed)
LVM for pt regarding her call about her foot pain. She states her foot was still hurting even after the injection she received last week. She states she is not feeling any relief. Advised her to ice her foot, use her Rx anti-inflammatory, we could write a Rx for Tramadol for her if she wanted, per [place]. She is to call back with questions or concerns

## 2016-06-20 ENCOUNTER — Ambulatory Visit: Payer: BLUE CROSS/BLUE SHIELD | Admitting: Podiatry

## 2016-06-24 ENCOUNTER — Ambulatory Visit (INDEPENDENT_AMBULATORY_CARE_PROVIDER_SITE_OTHER): Payer: BLUE CROSS/BLUE SHIELD | Admitting: Otolaryngology

## 2016-07-15 ENCOUNTER — Ambulatory Visit (INDEPENDENT_AMBULATORY_CARE_PROVIDER_SITE_OTHER): Payer: BLUE CROSS/BLUE SHIELD | Admitting: Otolaryngology

## 2016-09-23 ENCOUNTER — Encounter (INDEPENDENT_AMBULATORY_CARE_PROVIDER_SITE_OTHER): Payer: Self-pay | Admitting: Internal Medicine

## 2016-09-24 ENCOUNTER — Ambulatory Visit (INDEPENDENT_AMBULATORY_CARE_PROVIDER_SITE_OTHER): Payer: BLUE CROSS/BLUE SHIELD | Admitting: Internal Medicine

## 2016-09-24 ENCOUNTER — Encounter (INDEPENDENT_AMBULATORY_CARE_PROVIDER_SITE_OTHER): Payer: Self-pay | Admitting: Internal Medicine

## 2016-09-24 VITALS — BP 102/64 | HR 65 | Temp 97.7°F | Ht 65.0 in | Wt 197.1 lb

## 2016-09-24 DIAGNOSIS — R197 Diarrhea, unspecified: Secondary | ICD-10-CM

## 2016-09-24 DIAGNOSIS — I10 Essential (primary) hypertension: Secondary | ICD-10-CM | POA: Insufficient documentation

## 2016-09-24 DIAGNOSIS — K219 Gastro-esophageal reflux disease without esophagitis: Secondary | ICD-10-CM | POA: Insufficient documentation

## 2016-09-24 DIAGNOSIS — E7800 Pure hypercholesterolemia, unspecified: Secondary | ICD-10-CM | POA: Insufficient documentation

## 2016-09-24 HISTORY — DX: Gastro-esophageal reflux disease without esophagitis: K21.9

## 2016-09-24 MED ORDER — METRONIDAZOLE 500 MG PO TABS: 500.0000 mg | ORAL_TABLET | Freq: Two times a day (BID) | ORAL | 0 refills | Status: DC

## 2016-09-24 NOTE — Progress Notes (Addendum)
   Subjective:    Patient ID: Tracey Luna, female    DOB: [birth year], 55 y.o.   MRN: [id number]  HPI Referred by Bing Matter, PA and [facility] for abdominal pain /nausea. She tells me for the past 2 weeks she has had diarrhea, nausea, stomach pains.  She also has had a headache. She says when she puts herself on a bland diet she does pretty good. If she eats a regular diet she has had nausea. She does not think she had a fever. She did have chills.  She says today she feels better today. She ate a plain bagel this am and sips of coffee. She has had diarrhea x 2 weeks. She says all of her stool are loose.  She has been taking anti-diarrheal pills.  She has abdominal pain when she has a BM. She has nausea. She has not been on any recent antibiotics. Normally she has a BM 1-2 a day and most were formed.    09/12/2016 H and H 14.0 and 40.4, total bili 1.8. ALP 86, AST 18, ALT 19  12/09/2014 colonoscopy Dr. Verdia Kuba: Two diminutive polyps were found in the sigmoid colon and in the mid-ascending colon. Removed. A few small mouth diverticula were found in the colon.  A 63mm sessile polyp was found in at the sigmoid colon. polyp removed.  A couple of scattered diverticula  No biopsy/   Review of Systems Past Medical History:  Diagnosis Date  . GERD (gastroesophageal reflux disease)   . GERD (gastroesophageal reflux disease) 09/24/2016  . Hyperglycemia   . Hyperlipidemia   . Hypertension   . Stroke ([place])   . Varicose veins   . Vitamin D deficiency     Past Surgical History:  Procedure Laterality Date  . CHOLECYSTECTOMY      Allergies  Allergen Reactions  . Avelox [Moxifloxacin Hcl In Nacl]   . Penicillins   . Vancomycin     Current Outpatient Prescriptions on File Prior to Visit  Medication Sig Dispense Refill  . atorvastatin (LIPITOR) 20 MG tablet Take 20 mg by mouth daily.    . hydrochlorothiazide (HYDRODIURIL) 25 MG tablet Take 25 mg by mouth daily.    Marland Kitchen  omeprazole (PRILOSEC) 40 MG capsule Take 40 mg by mouth daily.    . fluticasone (FLONASE) 50 MCG/ACT nasal spray Place 2 sprays into both nostrils daily.     No current facility-administered medications on file prior to visit.        Objective:   Physical Exam Blood pressure 102/64, pulse 65, temperature 97.7 F (36.5 C), height 5\' 5"  (1.651 m), weight 197 lb 1.6 oz (89.4 kg). Alert and oriented. Skin warm and dry. Oral mucosa is moist.   . Sclera anicteric, conjunctivae is pink. Thyroid not enlarged. No cervical lymphadenopathy. Lungs clear. Heart regular rate and rhythm.  Abdomen is soft. Bowel sounds are positive. No hepatomegaly. No abdominal masses felt. No tenderness.  No edema to lower extremities.          Assessment & Plan:  Stool studies. Start Flagyl after you have turned stool sample in .

## 2016-09-24 NOTE — Patient Instructions (Signed)
GI pathogen. Flagyl 500mg  BID x 7 days.

## 2016-09-27 LAB — GASTROINTESTINAL PATHOGEN PANEL PCR
C. difficile Tox A/B, PCR: NOT DETECTED
CRYPTOSPORIDIUM, PCR: NOT DETECTED
Campylobacter, PCR: NOT DETECTED
E COLI (ETEC) LT/ST, PCR: NOT DETECTED
E coli (STEC) stx1/stx2, PCR: NOT DETECTED
E coli 0157, PCR: NOT DETECTED
GIARDIA LAMBLIA, PCR: NOT DETECTED
NOROVIRUS, PCR: NOT DETECTED
ROTAVIRUS, PCR: NOT DETECTED
SHIGELLA, PCR: NOT DETECTED
Salmonella, PCR: NOT DETECTED

## 2016-10-01 ENCOUNTER — Telehealth (INDEPENDENT_AMBULATORY_CARE_PROVIDER_SITE_OTHER): Payer: Self-pay | Admitting: Internal Medicine

## 2016-10-01 NOTE — Telephone Encounter (Signed)
She feels somewhat better .She will pick up Flagyl and call me Thursday

## 2016-10-01 NOTE — Telephone Encounter (Signed)
Patient calling for results  [phone]

## 2016-10-16 ENCOUNTER — Telehealth (INDEPENDENT_AMBULATORY_CARE_PROVIDER_SITE_OTHER): Payer: Self-pay | Admitting: Internal Medicine

## 2016-10-16 DIAGNOSIS — R109 Unspecified abdominal pain: Secondary | ICD-10-CM

## 2016-10-16 MED ORDER — DICYCLOMINE HCL 10 MG PO CAPS: 10.0000 mg | ORAL_CAPSULE | Freq: Three times a day (TID) | ORAL | 3 refills | Status: DC

## 2016-10-16 MED ORDER — DICYCLOMINE HCL 10 MG PO CAPS
10.0000 mg | ORAL_CAPSULE | Freq: Three times a day (TID) | ORAL | 3 refills | Status: DC
Start: 2016-10-16 — End: 2016-10-16

## 2016-10-16 NOTE — Telephone Encounter (Signed)
Rx for Dicyclomine sent to her pharmacy.  

## 2016-10-16 NOTE — Telephone Encounter (Signed)
Patient called and stated that she was giving Terri a progress report.  She stated that she is still having problems and really bad stomach cramps.  [phone]

## 2016-10-16 NOTE — Telephone Encounter (Signed)
She has 2 more days of the antibiotic.  She has cramping in her abdomen. She is having a BM several times a day and then she may not have one.  She has been taking some anti-diarrheal meds.

## 2016-10-18 ENCOUNTER — Encounter (INDEPENDENT_AMBULATORY_CARE_PROVIDER_SITE_OTHER): Payer: Self-pay

## 2016-11-26 ENCOUNTER — Encounter: Payer: Self-pay | Admitting: Podiatry

## 2016-11-26 ENCOUNTER — Ambulatory Visit (INDEPENDENT_AMBULATORY_CARE_PROVIDER_SITE_OTHER): Payer: BLUE CROSS/BLUE SHIELD | Admitting: Podiatry

## 2016-11-26 VITALS — BP 111/69 | HR 90 | Resp 18

## 2016-11-26 DIAGNOSIS — M19072 Primary osteoarthritis, left ankle and foot: Secondary | ICD-10-CM | POA: Diagnosis not present

## 2016-11-26 DIAGNOSIS — M722 Plantar fascial fibromatosis: Secondary | ICD-10-CM

## 2016-11-26 DIAGNOSIS — M2011 Hallux valgus (acquired), right foot: Secondary | ICD-10-CM | POA: Diagnosis not present

## 2016-11-26 DIAGNOSIS — M779 Enthesopathy, unspecified: Secondary | ICD-10-CM | POA: Diagnosis not present

## 2016-11-28 DIAGNOSIS — M19072 Primary osteoarthritis, left ankle and foot: Secondary | ICD-10-CM | POA: Insufficient documentation

## 2016-11-28 DIAGNOSIS — M722 Plantar fascial fibromatosis: Secondary | ICD-10-CM | POA: Insufficient documentation

## 2016-11-28 DIAGNOSIS — M2011 Hallux valgus (acquired), right foot: Secondary | ICD-10-CM | POA: Insufficient documentation

## 2016-11-28 NOTE — Progress Notes (Signed)
Subjective: 55 year old female presents the office today for concerns of recurrence of right heel pain as well as dorsal left midfoot pain. She's requesting injections to both of these areas. She states that she has continue with stretching, icing activities. His been ongoing for about a year if this point. She is inquiring other treatment options as well. Denies any numbness or tingling. No swelling or redness. The pain does not wake her up at night. She also has a bunion but she is not concerned about that at this time. Denies any systemic complaints such as fevers, chills, nausea, vomiting. No acute changes since last appointment, and no other complaints at this time.   Objective: AAO x3, NAD DP/PT pulses palpable bilaterally, CRT less than 3 seconds Tenderness to palpation along the plantar medial tubercle of the calcaneus at the insertion of plantar fascia on the right foot. There is no pain along the course of the plantar fascia within the arch of the foot. Plantar fascia appears to be intact. There is no pain with lateral compression of the calcaneus or pain with vibratory sensation. There is no pain along the course or insertion of the achilles tendon. Mild discomfort along the dorsal aspect the left midfoot. There is no specific area pinpoint bony tenderness or pain the vibratory sensation. No other areas of tenderness to bilateral lower extremities. Equinus is present. HAV is present. No open lesions or pre-ulcerative lesions.  No pain with calf compression, swelling, warmth, erythema  Assessment: Right heel pain, plantar fasciitis, left midfoot arthritis/capsulitis  Plan: -All treatment options discussed with the patient including all alternatives, risks, complications.  -X-rays and chart reviewed -Patient elects to proceed with steroid injection into the right heel. Under sterile skin preparation, a total of 2.5cc of kenalog 10, 0.5% Marcaine plain, and 2% lidocaine plain were infiltrated  into the symptomatic area without complication to the right heel and a mixture of Kenalog and local anesthetic was infiltrated into the dorsal aspect of the left foot. A band-aid was applied. Patient tolerated the injection well without complication. Post-injection care with discussed with the patient. Discussed with the patient to ice the area over the next couple of days to help prevent a steroid flare.  -Recommended continuing stretching, icing exercises daily. Discussed new orthotics. -Will start physical therapy as well. A prescription was provided today. -Patient encouraged to call the office with any questions, concerns, change in symptoms.   Celesta Gentile, DPM

## 2016-12-20 ENCOUNTER — Other Ambulatory Visit (HOSPITAL_COMMUNITY): Payer: Self-pay | Admitting: Family Medicine

## 2016-12-20 DIAGNOSIS — Z1231 Encounter for screening mammogram for malignant neoplasm of breast: Secondary | ICD-10-CM

## 2016-12-23 ENCOUNTER — Ambulatory Visit: Payer: BLUE CROSS/BLUE SHIELD | Admitting: Podiatry

## 2016-12-30 ENCOUNTER — Ambulatory Visit (HOSPITAL_COMMUNITY)
Admission: RE | Admit: 2016-12-30 | Discharge: 2016-12-30 | Disposition: A | Discharge status: Home / Self Care | Payer: BLUE CROSS/BLUE SHIELD | Source: Ambulatory Visit | Attending: Family Medicine | Admitting: Family Medicine

## 2016-12-30 DIAGNOSIS — Z1231 Encounter for screening mammogram for malignant neoplasm of breast: Secondary | ICD-10-CM | POA: Insufficient documentation

## 2017-02-05 ENCOUNTER — Encounter: Payer: Self-pay | Admitting: Podiatry

## 2017-02-05 ENCOUNTER — Ambulatory Visit (INDEPENDENT_AMBULATORY_CARE_PROVIDER_SITE_OTHER): Payer: BLUE CROSS/BLUE SHIELD | Admitting: Podiatry

## 2017-02-05 DIAGNOSIS — M2011 Hallux valgus (acquired), right foot: Secondary | ICD-10-CM

## 2017-02-05 DIAGNOSIS — M722 Plantar fascial fibromatosis: Secondary | ICD-10-CM

## 2017-02-05 MED ORDER — TRIAMCINOLONE ACETONIDE 10 MG/ML IJ SUSP
10.0000 mg | Freq: Once | INTRAMUSCULAR | Status: AC
  Administered 2017-02-05: 10 mg

## 2017-02-06 ENCOUNTER — Other Ambulatory Visit (INDEPENDENT_AMBULATORY_CARE_PROVIDER_SITE_OTHER): Payer: Self-pay | Admitting: Otolaryngology

## 2017-02-06 ENCOUNTER — Ambulatory Visit (INDEPENDENT_AMBULATORY_CARE_PROVIDER_SITE_OTHER): Payer: BLUE CROSS/BLUE SHIELD | Admitting: Otolaryngology

## 2017-02-06 DIAGNOSIS — D3709 Neoplasm of uncertain behavior of other specified sites of the oral cavity: Secondary | ICD-10-CM

## 2017-02-08 NOTE — Progress Notes (Signed)
Subjective:     Patient ID: Tracey Luna, female   DOB: [birth year], 56 y.o.   MRN: [id number]  HPI patient states she still having a lot of pain in her right heel and it's making it increasingly difficult for her to be active   Review of Systems     Objective:   Physical Exam  Neurovascular status intact negative Homans sign was noted with patient's right heel been very tender in the medial band and structural collapse of the arch noted bilateral secondary to inherited foot structure    Assessment:     Chronic plantar fasciitis right with acute inflammation with structural changes of the arch and foot    Plan:     H&P condition reviewed and steroidal injection administered plantar right heel. Patient tolerated well was given instructions on physical therapy shoe gear modifications and I did scanned for custom orthotics to lift the arch is up bilateral patient will be seen back when ready

## 2017-02-24 ENCOUNTER — Ambulatory Visit (INDEPENDENT_AMBULATORY_CARE_PROVIDER_SITE_OTHER): Payer: BLUE CROSS/BLUE SHIELD | Admitting: Otolaryngology

## 2017-02-24 DIAGNOSIS — K122 Cellulitis and abscess of mouth: Secondary | ICD-10-CM

## 2017-04-09 ENCOUNTER — Other Ambulatory Visit: Payer: BLUE CROSS/BLUE SHIELD

## 2017-05-08 ENCOUNTER — Ambulatory Visit (INDEPENDENT_AMBULATORY_CARE_PROVIDER_SITE_OTHER): Payer: BLUE CROSS/BLUE SHIELD | Admitting: Otolaryngology

## 2017-05-08 DIAGNOSIS — D3709 Neoplasm of uncertain behavior of other specified sites of the oral cavity: Secondary | ICD-10-CM | POA: Diagnosis not present

## 2017-05-09 ENCOUNTER — Other Ambulatory Visit: Payer: Self-pay | Admitting: Otolaryngology

## 2017-05-19 ENCOUNTER — Encounter (HOSPITAL_BASED_OUTPATIENT_CLINIC_OR_DEPARTMENT_OTHER): Payer: Self-pay | Admitting: *Deleted

## 2017-05-20 ENCOUNTER — Ambulatory Visit (HOSPITAL_BASED_OUTPATIENT_CLINIC_OR_DEPARTMENT_OTHER): Payer: BLUE CROSS/BLUE SHIELD | Admitting: Anesthesiology

## 2017-05-20 ENCOUNTER — Encounter (HOSPITAL_BASED_OUTPATIENT_CLINIC_OR_DEPARTMENT_OTHER)
Admission: RE | Disposition: A | Discharge status: Home / Self Care | Payer: Self-pay | Source: Ambulatory Visit | Attending: Otolaryngology

## 2017-05-20 ENCOUNTER — Encounter (HOSPITAL_BASED_OUTPATIENT_CLINIC_OR_DEPARTMENT_OTHER): Payer: Self-pay | Admitting: Anesthesiology

## 2017-05-20 ENCOUNTER — Ambulatory Visit (HOSPITAL_BASED_OUTPATIENT_CLINIC_OR_DEPARTMENT_OTHER)
Admission: RE | Admit: 2017-05-20 | Discharge: 2017-05-20 | Disposition: A | Discharge status: Home / Self Care | Payer: BLUE CROSS/BLUE SHIELD | Source: Ambulatory Visit | Attending: Otolaryngology | Admitting: Otolaryngology

## 2017-05-20 DIAGNOSIS — E785 Hyperlipidemia, unspecified: Secondary | ICD-10-CM | POA: Diagnosis not present

## 2017-05-20 DIAGNOSIS — F17200 Nicotine dependence, unspecified, uncomplicated: Secondary | ICD-10-CM | POA: Insufficient documentation

## 2017-05-20 DIAGNOSIS — J449 Chronic obstructive pulmonary disease, unspecified: Secondary | ICD-10-CM | POA: Diagnosis not present

## 2017-05-20 DIAGNOSIS — Z79899 Other long term (current) drug therapy: Secondary | ICD-10-CM | POA: Diagnosis not present

## 2017-05-20 DIAGNOSIS — F419 Anxiety disorder, unspecified: Secondary | ICD-10-CM | POA: Insufficient documentation

## 2017-05-20 DIAGNOSIS — Z8673 Personal history of transient ischemic attack (TIA), and cerebral infarction without residual deficits: Secondary | ICD-10-CM | POA: Diagnosis not present

## 2017-05-20 DIAGNOSIS — M1990 Unspecified osteoarthritis, unspecified site: Secondary | ICD-10-CM | POA: Diagnosis not present

## 2017-05-20 DIAGNOSIS — K219 Gastro-esophageal reflux disease without esophagitis: Secondary | ICD-10-CM | POA: Diagnosis not present

## 2017-05-20 DIAGNOSIS — I10 Essential (primary) hypertension: Secondary | ICD-10-CM | POA: Insufficient documentation

## 2017-05-20 DIAGNOSIS — D3709 Neoplasm of uncertain behavior of other specified sites of the oral cavity: Secondary | ICD-10-CM | POA: Diagnosis not present

## 2017-05-20 DIAGNOSIS — D1039 Benign neoplasm of other parts of mouth: Secondary | ICD-10-CM | POA: Insufficient documentation

## 2017-05-20 HISTORY — DX: Nausea with vomiting, unspecified: R11.2

## 2017-05-20 HISTORY — DX: Chronic obstructive pulmonary disease, unspecified: J44.9

## 2017-05-20 HISTORY — DX: Other complications of anesthesia, initial encounter: T88.59XA

## 2017-05-20 HISTORY — DX: Adverse effect of unspecified anesthetic, initial encounter: T41.45XA

## 2017-05-20 HISTORY — DX: Prediabetes: R73.03

## 2017-05-20 HISTORY — DX: Nausea with vomiting, unspecified: Z98.890

## 2017-05-20 HISTORY — PX: EXCISION ORAL TUMOR: SHX6265

## 2017-05-20 HISTORY — DX: Anxiety disorder, unspecified: F41.9

## 2017-05-20 LAB — POCT I-STAT, CHEM 8
BUN: 17 mg/dL (ref 6–20)
CHLORIDE: 101 mmol/L (ref 101–111)
Calcium, Ion: 1.16 mmol/L (ref 1.15–1.40)
Creatinine, Ser: 0.7 mg/dL (ref 0.44–1.00)
GLUCOSE: 148 mg/dL — AB (ref 65–99)
HEMATOCRIT: 41 % (ref 36.0–46.0)
HEMOGLOBIN: 13.9 g/dL (ref 12.0–15.0)
POTASSIUM: 3.6 mmol/L (ref 3.5–5.1)
Sodium: 139 mmol/L (ref 135–145)
TCO2: 26 mmol/L (ref 0–100)

## 2017-05-20 SURGERY — EXCISION, NEOPLASM, MOUTH
Anesthesia: General | Site: Mouth | Laterality: Right

## 2017-05-20 MED ORDER — LIDOCAINE 2% (20 MG/ML) 5 ML SYRINGE
INTRAMUSCULAR | Status: AC
  Filled 2017-05-20: qty 5

## 2017-05-20 MED ORDER — MIDAZOLAM HCL 2 MG/2ML IJ SOLN
1.0000 mg | INTRAMUSCULAR | Status: DC | PRN
  Administered 2017-05-20: 2 mg via INTRAVENOUS

## 2017-05-20 MED ORDER — PROMETHAZINE HCL 25 MG/ML IJ SOLN
6.2500 mg | INTRAMUSCULAR | Status: DC | PRN
  Administered 2017-05-20: 6.25 mg via INTRAVENOUS

## 2017-05-20 MED ORDER — ROCURONIUM BROMIDE 50 MG/5ML IV SOSY
PREFILLED_SYRINGE | INTRAVENOUS | Status: DC | PRN
  Administered 2017-05-20: 50 mg via INTRAVENOUS

## 2017-05-20 MED ORDER — SCOPOLAMINE 1 MG/3DAYS TD PT72
MEDICATED_PATCH | TRANSDERMAL | Status: AC
  Filled 2017-05-20: qty 1

## 2017-05-20 MED ORDER — LIDOCAINE-EPINEPHRINE 1 %-1:100000 IJ SOLN
INTRAMUSCULAR | Status: DC | PRN
  Administered 2017-05-20: 3 mL

## 2017-05-20 MED ORDER — SUGAMMADEX SODIUM 200 MG/2ML IV SOLN
INTRAVENOUS | Status: DC | PRN
  Administered 2017-05-20: 200 mg via INTRAVENOUS

## 2017-05-20 MED ORDER — DEXAMETHASONE SODIUM PHOSPHATE 4 MG/ML IJ SOLN
INTRAMUSCULAR | Status: DC | PRN
  Administered 2017-05-20: 10 mg via INTRAVENOUS

## 2017-05-20 MED ORDER — ONDANSETRON HCL 4 MG/2ML IJ SOLN
INTRAMUSCULAR | Status: AC
  Filled 2017-05-20: qty 2

## 2017-05-20 MED ORDER — FENTANYL CITRATE (PF) 100 MCG/2ML IJ SOLN: 50.0000 ug | INTRAMUSCULAR | Status: DC | PRN

## 2017-05-20 MED ORDER — FENTANYL CITRATE (PF) 100 MCG/2ML IJ SOLN: 25.0000 ug | INTRAMUSCULAR | Status: DC | PRN

## 2017-05-20 MED ORDER — LACTATED RINGERS IV SOLN
INTRAVENOUS | Status: DC
  Administered 2017-05-20 (×2): via INTRAVENOUS

## 2017-05-20 MED ORDER — FENTANYL CITRATE (PF) 100 MCG/2ML IJ SOLN
INTRAMUSCULAR | Status: AC
  Filled 2017-05-20: qty 2

## 2017-05-20 MED ORDER — MEPERIDINE HCL 25 MG/ML IJ SOLN: 6.2500 mg | INTRAMUSCULAR | Status: DC | PRN

## 2017-05-20 MED ORDER — PROMETHAZINE HCL 25 MG/ML IJ SOLN
INTRAMUSCULAR | Status: AC
  Filled 2017-05-20: qty 1

## 2017-05-20 MED ORDER — CLINDAMYCIN PHOSPHATE 600 MG/50ML IV SOLN
INTRAVENOUS | Status: AC
  Filled 2017-05-20: qty 50

## 2017-05-20 MED ORDER — CLINDAMYCIN PHOSPHATE 600 MG/50ML IV SOLN
INTRAVENOUS | Status: DC | PRN
  Administered 2017-05-20: 600 mg via INTRAVENOUS

## 2017-05-20 MED ORDER — LIDOCAINE 2% (20 MG/ML) 5 ML SYRINGE
INTRAMUSCULAR | Status: DC | PRN
  Administered 2017-05-20: 100 mg via INTRAVENOUS

## 2017-05-20 MED ORDER — LACTATED RINGERS IV SOLN: INTRAVENOUS | Status: DC

## 2017-05-20 MED ORDER — PROPOFOL 10 MG/ML IV BOLUS
INTRAVENOUS | Status: DC | PRN
  Administered 2017-05-20: 200 mg via INTRAVENOUS

## 2017-05-20 MED ORDER — CLINDAMYCIN HCL 300 MG PO CAPS: 300.0000 mg | ORAL_CAPSULE | Freq: Three times a day (TID) | ORAL | 0 refills | Status: AC

## 2017-05-20 MED ORDER — DEXAMETHASONE SODIUM PHOSPHATE 10 MG/ML IJ SOLN
INTRAMUSCULAR | Status: AC
  Filled 2017-05-20: qty 1

## 2017-05-20 MED ORDER — PHENYLEPHRINE HCL 10 MG/ML IJ SOLN
INTRAMUSCULAR | Status: DC | PRN
  Administered 2017-05-20 (×3): 80 ug via INTRAVENOUS

## 2017-05-20 MED ORDER — OXYCODONE-ACETAMINOPHEN 5-325 MG PO TABS: 1.0000 | ORAL_TABLET | ORAL | 0 refills | Status: DC | PRN

## 2017-05-20 MED ORDER — OXYCODONE HCL 5 MG PO TABS: 5.0000 mg | ORAL_TABLET | Freq: Once | ORAL | Status: DC | PRN

## 2017-05-20 MED ORDER — ONDANSETRON HCL 4 MG/2ML IJ SOLN
INTRAMUSCULAR | Status: DC | PRN
  Administered 2017-05-20: 4 mg via INTRAVENOUS

## 2017-05-20 MED ORDER — MIDAZOLAM HCL 2 MG/2ML IJ SOLN
INTRAMUSCULAR | Status: AC
  Filled 2017-05-20: qty 2

## 2017-05-20 MED ORDER — SCOPOLAMINE 1 MG/3DAYS TD PT72
1.0000 | MEDICATED_PATCH | Freq: Once | TRANSDERMAL | Status: DC | PRN
  Administered 2017-05-20: 1.5 mg via TRANSDERMAL

## 2017-05-20 MED ORDER — OXYCODONE HCL 5 MG/5ML PO SOLN: 5.0000 mg | Freq: Once | ORAL | Status: DC | PRN

## 2017-05-20 SURGICAL SUPPLY — 29 items
BLADE SURG 15 STRL LF DISP TIS (BLADE) ×1 IMPLANT
BLADE SURG 15 STRL SS (BLADE) ×1
CANISTER SUCT 1200ML W/VALVE (MISCELLANEOUS) ×2 IMPLANT
COVER MAYO STAND STRL (DRAPES) ×2 IMPLANT
ELECT COATED BLADE 2.86 ST (ELECTRODE) ×2 IMPLANT
ELECT NEEDLE BLADE 2-5/6 (NEEDLE) IMPLANT
ELECT REM PT RETURN 9FT ADLT (ELECTROSURGICAL) ×2
ELECTRODE REM PT RTRN 9FT ADLT (ELECTROSURGICAL) ×1 IMPLANT
GLOVE BIO SURGEON STRL SZ7.5 (GLOVE) ×2 IMPLANT
GLOVE BIOGEL PI IND STRL 7.0 (GLOVE) ×1 IMPLANT
GLOVE BIOGEL PI INDICATOR 7.0 (GLOVE) ×1
GLOVE SURG SS PI 6.5 STRL IVOR (GLOVE) ×2 IMPLANT
GLOVE SURG SS PI 7.5 STRL IVOR (GLOVE) ×2 IMPLANT
NEEDLE PRECISIONGLIDE 27X1.5 (NEEDLE) ×2 IMPLANT
PACK BASIN DAY SURGERY FS (CUSTOM PROCEDURE TRAY) ×2 IMPLANT
PENCIL BUTTON HOLSTER BLD 10FT (ELECTRODE) ×2 IMPLANT
PENCIL FOOT CONTROL (ELECTRODE) IMPLANT
SHEET MEDIUM DRAPE 40X70 STRL (DRAPES) ×2 IMPLANT
SPONGE NEURO XRAY DETECT 1X3 (DISPOSABLE) IMPLANT
SPONGE TONSIL 1.25 RF SGL STRG (GAUZE/BANDAGES/DRESSINGS) IMPLANT
SUCTION FRAZIER HANDLE 10FR (MISCELLANEOUS)
SUCTION TUBE FRAZIER 10FR DISP (MISCELLANEOUS) IMPLANT
SUT CHROMIC 5 0 P 3 (SUTURE) IMPLANT
SUT VIC AB 4-0 SH 27 (SUTURE) ×1
SUT VIC AB 4-0 SH 27XANBCTRL (SUTURE) ×1 IMPLANT
SYR CONTROL 10ML LL (SYRINGE) ×2 IMPLANT
TOWEL OR 17X24 6PK STRL BLUE (TOWEL DISPOSABLE) ×2 IMPLANT
TUBE CONNECTING 20X1/4 (TUBING) ×2 IMPLANT
YANKAUER SUCT BULB TIP NO VENT (SUCTIONS) ×2 IMPLANT

## 2017-05-20 NOTE — Anesthesia Postprocedure Evaluation (Signed)
Anesthesia Post Note  Patient: Tracey Luna  Procedure(s) Performed: Procedure(s) (LRB): EXCISION RIGHT BUCCAL MUCOSAL LESION (Right)     Patient location during evaluation: PACU Anesthesia Type: General Level of consciousness: awake and alert Pain management: pain level controlled Vital Signs Assessment: post-procedure vital signs reviewed and stable Respiratory status: spontaneous breathing, nonlabored ventilation, respiratory function stable and patient connected to nasal cannula oxygen Cardiovascular status: blood pressure returned to baseline and stable Postop Assessment: no signs of nausea or vomiting Anesthetic complications: no    Last Vitals:  Vitals:   05/20/17 1045 05/20/17 1105  BP: 130/89 (!) 145/84  Pulse: 65   Resp: 15   Temp:  36.7 C             Effie Berkshire

## 2017-05-20 NOTE — H&P (Signed)
Cc: Buccal mucosa lesion  HPI: The patient is a 56 y/o female who returns today for follow up evaluation of her right buccal mucosal lesion. She was last seen on 02/24/2017. The patient underwent a punch biopsy of the lesion. Pathology was found to be consistent with mixed acute/chronic submucosal inflammation. There was no evidence of malignancy. She returns today noting persistent mouth discomfort. The patient complains that she frequently bites the area. No other ENT, GI, or respiratory issue noted since the last visit.   Exam General: Communicates without difficulty, well nourished, no acute distress. Head: Normocephalic, no evidence injury, no tenderness, facial buttresses intact without stepoff. Eyes: PERRL, EOMI. No scleral icterus, conjunctivae clear. Neuro: CN II exam reveals vision grossly intact. No nystagmus at any point of gaze. Ears: Auricles well formed without lesions. Ear canals are intact without mass or lesion. No erythema or edema is appreciated. The TMs are intact without fluid. Nose: External evaluation reveals normal support and skin without lesions. Dorsum is intact. Anterior rhinoscopy reveals healthy pink mucosa over anterior aspect of inferior turbinates and intact septum. No purulence noted. Oral:   A 1.5 cm mucosal irregularity is noted along the right buccal mucosa. Neck: Full range of motion without pain. There is no significant lymphadenopathy. No masses palpable. Thyroid bed within normal limits to palpation. Parotid glands and submandibular glands equal bilaterally without mass. Trachea is midline. Neuro:  CN 2-12 grossly intact. Gait normal. Vestibular: No nystagmus at any point of gaze. The cerebellar examination is unremarkable.   Assessment 1. Right buccal mucosal lesion. Pathology is consistent with acute/chronic submucosal inflammation without evidence of malignancy.   Plan  1. The physical exam findings are reviewed with the patient.  2. Treatment options include  observation, repeat prednisone, or surgical excision. The risks, benefits, alternatives, and details of the procedure are reviewed with the patient. Questions are invited and answered. 3. The patient would like to proceed with surgical intervention. The procedure will be scheduled in accordance to the patient's schedule.

## 2017-05-20 NOTE — Transfer of Care (Signed)
Immediate Anesthesia Transfer of Care Note  Patient: Tracey Luna  Procedure(s) Performed: Procedure(s): EXCISION RIGHT BUCCAL MUCOSAL LESION (Right)  Patient Location: PACU  Anesthesia Type:General  Level of Consciousness: awake  Airway & Oxygen Therapy: Patient Spontanous Breathing and Patient connected to face mask oxygen  Post-op Assessment: Report given to RN and Post -op Vital signs reviewed and stable  Post vital signs: Reviewed and stable  Last Vitals:  Vitals:   05/20/17 0821  BP: 114/75  Pulse: 79  Resp: 20  Temp: 36.4 C    Last Pain:  Vitals:   05/20/17 0821  TempSrc: Oral         Complications: No apparent anesthesia complications

## 2017-05-20 NOTE — Op Note (Signed)
DATE OF PROCEDURE:  05/20/2017                              OPERATIVE REPORT  SURGEON:  Leta Baptist, MD  PREOPERATIVE DIAGNOSES: 1. Right buccal mucosal mass   POSTOPERATIVE DIAGNOSES: 1. Right buccal mucosal mass   PROCEDURE PERFORMED:  Excision of right buccal mass with mucosal flap closure (CPT [id number])   ANESTHESIA:  General endotracheal tube anesthesia.  COMPLICATIONS:  None.  ESTIMATED BLOOD LOSS:  Minimal.  INDICATION FOR PROCEDURE:  Tracey Luna is a 56 y.o. female with a history of a soft tissue lesion on the right side of her mouth. The patient first noticed the lesion 1-2 years ago but the area has continued to enlarge. The patient is now having some right sided jaw and ear pain. The patient underwent a punch biopsy of the lesion. Pathology was found to be consistent with mixed acute/chronic submucosal inflammation. There was no evidence of malignancy. She continued to complain of persistent mouth discomfort. The patient reported that she frequently bit the soft tissue mass. Based on the above findings, the decision was made for the patient to undergo the above stated procedure. Likelihood of success in reducing symptoms was also discussed.  The risks, benefits, alternatives, and details of the procedure were discussed with the patient.  Questions were invited and answered.  Informed consent was obtained.  DESCRIPTION:  The patient was taken to the operating room and placed supine on the operating table.  General endotracheal tube anesthesia was administered by the anesthesiologist.  The patient was positioned and prepped and draped in a standard fashion for oral surgery. An oral retractor was used to expose the oral cavity.   A 2 cm fungating soft tissue mass was noted on the right mucosal mucosa. The mass was noted to be inferior to the Stensen's duct. 1% lidocaine with 1-100,000 epinephrine was infiltrated around the soft tissue mass. An elliptical incision was made around the  fungating mass. The entire lesion, together with the submucosal tissue, was excised and sent to the pathology department for permanent histologic identification. The surgical site was copiously irrigated. The defect was noted to be 3 cm in diameter.  In order to achieve satisfactory closure of the mucosal defect, the surrounding mucosal tissue was carefully undermined. A rotation flap was fashioned inferior to the defect. The rotation flap was used to cover the surgical defect. The flap was sutured in placed with interrupted 4 Vicryl sutures. The surgical site was copiously irrigated.  The mouth gag was removed.  The care of the patient was turned over to the anesthesiologist.  The patient was awakened from anesthesia without difficulty.  The patient was extubated and transferred to the recovery room in good condition.  OPERATIVE FINDINGS:  Right buccal mucosal mass.  SPECIMEN:  Right buccal mucosal mass.  FOLLOWUP CARE:  The patient will be discharged home once awake and alert.   The patient will follow up in my office in approximately 1 week.  Tracey Luna 05/20/2017 10:04 AM

## 2017-05-20 NOTE — Anesthesia Procedure Notes (Signed)
Procedure Name: Intubation Date/Time: 05/20/2017 9:21 AM Performed by: Lieutenant Diego Pre-anesthesia Checklist: Patient identified, Emergency Drugs available, Suction available and Patient being monitored Patient Re-evaluated:Patient Re-evaluated prior to inductionOxygen Delivery Method: Circle system utilized Preoxygenation: Pre-oxygenation with 100% oxygen Intubation Type: IV induction Ventilation: Mask ventilation without difficulty Laryngoscope Size: Miller and 2 Grade View: Grade I Tube type: Oral Tube size: 7.0 mm Number of attempts: 1 Airway Equipment and Method: Stylet and Oral airway Placement Confirmation: ETT inserted through vocal cords under direct vision,  positive ETCO2 and breath sounds checked- equal and bilateral Secured at: 22 cm Tube secured with: Tape Dental Injury: Teeth and Oropharynx as per pre-operative assessment

## 2017-05-20 NOTE — Anesthesia Preprocedure Evaluation (Addendum)
Anesthesia Evaluation  Patient identified by MRN, date of birth, ID band Patient awake    Reviewed: Allergy & Precautions, NPO status , Patient's Chart, lab work & pertinent test results  History of Anesthesia Complications (+) PONV and history of anesthetic complications  Airway Mallampati: II  TM Distance: >3 FB Neck ROM: Full    Dental  (+) Teeth Intact, Dental Advisory Given   Pulmonary COPD, Current Smoker,    breath sounds clear to auscultation       Cardiovascular hypertension, Pt. on medications  Rhythm:Regular Rate:Normal     Neuro/Psych Anxiety CVA    GI/Hepatic Neg liver ROS, GERD  Medicated,  Endo/Other  negative endocrine ROS  Renal/GU negative Renal ROS  negative genitourinary   Musculoskeletal  (+) Arthritis , Osteoarthritis,    Abdominal   Peds negative pediatric ROS (+)  Hematology negative hematology ROS (+)   Anesthesia Other Findings - HLD  Reproductive/Obstetrics negative OB ROS                            Anesthesia Physical Anesthesia Plan  ASA: II  Anesthesia Plan: General   Post-op Pain Management:    Induction: Intravenous  PONV Risk Score and Plan: 3 and Ondansetron, Dexamethasone, Propofol and Midazolam  Airway Management Planned: Oral ETT  Additional Equipment:   Intra-op Plan:   Post-operative Plan: Extubation in OR  Informed Consent: I have reviewed the patients History and Physical, chart, labs and discussed the procedure including the risks, benefits and alternatives for the proposed anesthesia with the patient or authorized representative who has indicated his/her understanding and acceptance.   Dental advisory given  Plan Discussed with: CRNA  Anesthesia Plan Comments:         Anesthesia Quick Evaluation

## 2017-05-20 NOTE — Discharge Instructions (Addendum)
The patient may resume all her previous activities and diet. She will follow-up in my office in one week. ° ° ° ° ° ° °Post Anesthesia Home Care Instructions ° °Activity: °Get plenty of rest for the remainder of the day. A responsible individual must stay with you for 24 hours following the procedure.  °For the next 24 hours, DO NOT: °-Drive a car °-Operate machinery °-Drink alcoholic beverages °-Take any medication unless instructed by your physician °-Make any legal decisions or sign important papers. ° °Meals: °Start with liquid foods such as gelatin or soup. Progress to regular foods as tolerated. Avoid greasy, spicy, heavy foods. If nausea and/or vomiting occur, drink only clear liquids until the nausea and/or vomiting subsides. Call your physician if vomiting continues. ° °Special Instructions/Symptoms: °Your throat may feel dry or sore from the anesthesia or the breathing tube placed in your throat during surgery. If this causes discomfort, gargle with warm salt water. The discomfort should disappear within 24 hours. ° °If you had a scopolamine patch placed behind your ear for the management of post- operative nausea and/or vomiting: ° °1. The medication in the patch is effective for 72 hours, after which it should be removed.  Wrap patch in a tissue and discard in the trash. Wash hands thoroughly with soap and water. °2. You may remove the patch earlier than 72 hours if you experience unpleasant side effects which may include dry mouth, dizziness or visual disturbances. °3. Avoid touching the patch. Wash your hands with soap and water after contact with the patch. °  ° °

## 2017-05-21 ENCOUNTER — Encounter (HOSPITAL_BASED_OUTPATIENT_CLINIC_OR_DEPARTMENT_OTHER): Payer: Self-pay | Admitting: Otolaryngology

## 2017-05-26 ENCOUNTER — Ambulatory Visit (INDEPENDENT_AMBULATORY_CARE_PROVIDER_SITE_OTHER): Payer: BLUE CROSS/BLUE SHIELD | Admitting: Otolaryngology

## 2017-07-22 ENCOUNTER — Telehealth: Payer: Self-pay | Admitting: *Deleted

## 2017-07-22 NOTE — Telephone Encounter (Signed)
Pt states she was an established pt and wanted to know if Dr. Paulla Dolly could take care of her problem. Unable to leave a message on work line busy. Left message on mobile informing pt that our doctors were able to treat any foot or ankle problem concerning skin, muscle, nerve, or function.

## 2017-08-06 ENCOUNTER — Ambulatory Visit (INDEPENDENT_AMBULATORY_CARE_PROVIDER_SITE_OTHER): Payer: Commercial Managed Care - PPO

## 2017-08-06 ENCOUNTER — Other Ambulatory Visit: Payer: Self-pay | Admitting: Podiatry

## 2017-08-06 ENCOUNTER — Telehealth: Payer: Self-pay | Admitting: *Deleted

## 2017-08-06 ENCOUNTER — Ambulatory Visit (INDEPENDENT_AMBULATORY_CARE_PROVIDER_SITE_OTHER): Payer: Commercial Managed Care - PPO | Admitting: Podiatry

## 2017-08-06 ENCOUNTER — Encounter: Payer: Self-pay | Admitting: Podiatry

## 2017-08-06 DIAGNOSIS — M779 Enthesopathy, unspecified: Secondary | ICD-10-CM

## 2017-08-06 DIAGNOSIS — M21619 Bunion of unspecified foot: Secondary | ICD-10-CM

## 2017-08-06 DIAGNOSIS — M79671 Pain in right foot: Secondary | ICD-10-CM

## 2017-08-06 DIAGNOSIS — M79672 Pain in left foot: Secondary | ICD-10-CM | POA: Diagnosis not present

## 2017-08-06 DIAGNOSIS — M19072 Primary osteoarthritis, left ankle and foot: Secondary | ICD-10-CM

## 2017-08-06 MED ORDER — TRIAMCINOLONE ACETONIDE 10 MG/ML IJ SUSP
10.0000 mg | Freq: Once | INTRAMUSCULAR | Status: AC
  Administered 2017-08-06: 10 mg

## 2017-08-06 MED ORDER — METHYLPREDNISOLONE 4 MG PO TBPK: ORAL_TABLET | ORAL | 0 refills | Status: DC

## 2017-08-06 NOTE — Telephone Encounter (Signed)
Pt states her medication from today is not at her pharmacy, [facility] in [place]. I reviewed Meds & Orders and the medication was sent to [facility]. I informed pt of the mistake and apologized and pt stated she is now in [place] and would like rx sent to the [facility].

## 2017-08-06 NOTE — Progress Notes (Signed)
Subjective:    Patient ID: Tracey Luna, female   DOB: 55 y.o.   MRN: [id number]   HPI patient presents stating she's had a lot of pain on top of her feet and the pain especially is worse after sitting and when getting up in the morning. Patient states though it does not seem to be heels which she's had in the past    ROS      Objective:  Physical Exam Neurovascular status intact negative Homans sign was noted with dorsal pain in the tendinous complex bilateral with inflammation fluid buildup and is noted to have minimal discomfort plantar within the heel arch or forefoot    Assessment:    Acute tendinitis with inflammatory changes dorsal bilateral with mild to moderate plantar pain     Plan:    H&P x-rays reviewed and today I went ahead and injected the dorsal tendon complex bilateral 3 mg Kenalog 5 mg Xylocaine instructed on physical therapy and reappoint to recheck  X-rays indicate there is moderate depression of the arch with structural bunion deformity and midfoot arthritis of a moderate nature

## 2017-08-21 ENCOUNTER — Ambulatory Visit: Payer: Commercial Managed Care - PPO | Admitting: Podiatry

## 2017-10-15 ENCOUNTER — Telehealth: Payer: Self-pay | Admitting: *Deleted

## 2017-10-15 NOTE — Telephone Encounter (Signed)
Pt states she received injections 08/06/2017 and they usually last 6 weeks but didn't this time and before she makes another appt she would like to know when she would be eligible for another injection.

## 2017-10-16 ENCOUNTER — Encounter: Payer: Self-pay | Admitting: Podiatry

## 2017-10-21 ENCOUNTER — Other Ambulatory Visit: Payer: Self-pay | Admitting: Podiatry

## 2017-10-31 ENCOUNTER — Ambulatory Visit (INDEPENDENT_AMBULATORY_CARE_PROVIDER_SITE_OTHER): Payer: Commercial Managed Care - PPO | Admitting: Podiatry

## 2017-10-31 ENCOUNTER — Encounter: Payer: Self-pay | Admitting: Podiatry

## 2017-10-31 DIAGNOSIS — M21619 Bunion of unspecified foot: Secondary | ICD-10-CM

## 2017-10-31 DIAGNOSIS — M779 Enthesopathy, unspecified: Secondary | ICD-10-CM

## 2017-10-31 DIAGNOSIS — M7750 Other enthesopathy of unspecified foot: Secondary | ICD-10-CM

## 2017-10-31 MED ORDER — TRIAMCINOLONE ACETONIDE 10 MG/ML IJ SUSP
10.0000 mg | Freq: Once | INTRAMUSCULAR | Status: AC
  Administered 2017-10-31: 10 mg

## 2017-10-31 NOTE — Progress Notes (Signed)
Subjective:   Patient ID: Tracey Luna, female   DOB: 56 y.o.   MRN: [id number]   HPI Patient presents stating the top of her feet are killing her again and that she had about 2 months of relief with the last series of injections.   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  Patient also is given a prescription for a compounding cream to try to reduce the inflammation and will be seen back to reevaluate

## 2018-03-03 ENCOUNTER — Other Ambulatory Visit (HOSPITAL_COMMUNITY): Payer: Self-pay | Admitting: Family Medicine

## 2018-03-03 DIAGNOSIS — Z1231 Encounter for screening mammogram for malignant neoplasm of breast: Secondary | ICD-10-CM

## 2018-03-16 ENCOUNTER — Ambulatory Visit (HOSPITAL_COMMUNITY)
Admission: RE | Admit: 2018-03-16 | Discharge: 2018-03-16 | Disposition: A | Discharge status: Home / Self Care | Payer: Commercial Managed Care - PPO | Source: Ambulatory Visit | Attending: Family Medicine | Admitting: Family Medicine

## 2018-03-16 ENCOUNTER — Encounter (HOSPITAL_COMMUNITY): Payer: Self-pay

## 2018-03-16 DIAGNOSIS — Z1231 Encounter for screening mammogram for malignant neoplasm of breast: Secondary | ICD-10-CM | POA: Diagnosis present

## 2018-04-07 ENCOUNTER — Telehealth: Payer: Self-pay | Admitting: *Deleted

## 2018-04-07 NOTE — Telephone Encounter (Signed)
Pt states she is in horrible pain and would like to make an appt but wants to make sure she can get a cortisone injection, she has already had 3.

## 2018-04-07 NOTE — Telephone Encounter (Signed)
I spoke pt and informed she may be able to get another injection, but it would be up to Dr. Paulla Dolly. Pt states she made an appt.

## 2018-04-09 ENCOUNTER — Ambulatory Visit: Payer: Commercial Managed Care - PPO

## 2018-04-09 ENCOUNTER — Ambulatory Visit (INDEPENDENT_AMBULATORY_CARE_PROVIDER_SITE_OTHER): Payer: Commercial Managed Care - PPO | Admitting: Podiatry

## 2018-04-09 ENCOUNTER — Encounter: Payer: Self-pay | Admitting: Podiatry

## 2018-04-09 DIAGNOSIS — M779 Enthesopathy, unspecified: Secondary | ICD-10-CM | POA: Diagnosis not present

## 2018-04-09 DIAGNOSIS — M19072 Primary osteoarthritis, left ankle and foot: Secondary | ICD-10-CM

## 2018-04-09 DIAGNOSIS — T148XXA Other injury of unspecified body region, initial encounter: Secondary | ICD-10-CM

## 2018-04-09 DIAGNOSIS — M79671 Pain in right foot: Secondary | ICD-10-CM

## 2018-04-09 DIAGNOSIS — M21619 Bunion of unspecified foot: Secondary | ICD-10-CM

## 2018-04-09 MED ORDER — TRAMADOL HCL 50 MG PO TABS: 50.0000 mg | ORAL_TABLET | Freq: Three times a day (TID) | ORAL | 2 refills | Status: DC

## 2018-04-09 MED ORDER — TRIAMCINOLONE ACETONIDE 10 MG/ML IJ SUSP
10.0000 mg | Freq: Once | INTRAMUSCULAR | Status: AC
  Administered 2018-04-09: 10 mg

## 2018-04-09 NOTE — Progress Notes (Signed)
Subjective:   Patient ID: Tracey Luna, female   DOB: 57 y.o.   MRN: [id number]   HPI Patient states my right ankle is really bothering me and is gradually getting worse.  Patient states that the injections are only hoping for a short period of time and it seems like it is becoming more more of an issue for her and she is having trouble with weightbearing   ROS      Objective:  Physical Exam  Neurovascular status intact with patient having moderate flatfoot deformity and is noted to have exquisite discomfort in the mid foot and also the posterior tibial tendon across the medial malleolus and its insertion into the navicular.  Patient's discomfort continues to worsen and she also has severe structural bunion deformity     Assessment:  Chronic tendinitis with possibility for tendon tear posterior tibial right with midfoot arthritis right hip possibility for chronic arthritis of the metatarsal cuneiform joint surfaces were midtarsal joints     Plan:  H&P discussion about this with patient.  I do think we will need to consider MRI to rule out a torn tendon along with possibility for bracing or possible surgical intervention.  At this point she is going to have an MRI done in the next several months and I did do a careful sheath injection posterior tib to try to buy her relief 3 mg Kenalog 5 mg Xylocaine.  Reappoint for reevaluation for 1 week if the results of the MRI

## 2018-04-10 ENCOUNTER — Ambulatory Visit: Payer: Commercial Managed Care - PPO | Admitting: Podiatry

## 2018-05-19 ENCOUNTER — Telehealth: Payer: Self-pay | Admitting: Podiatry

## 2018-05-19 NOTE — Telephone Encounter (Signed)
Patient called and wants to go ahead and get a MRI appointment set up. If you can call patient back at [phone]

## 2018-05-19 NOTE — Telephone Encounter (Signed)
Patient called to see if she can schedule an appointment for a MRI for both feet. If you can call patient back at [phone]

## 2018-05-20 ENCOUNTER — Encounter: Payer: Self-pay | Admitting: Podiatry

## 2018-05-20 DIAGNOSIS — T148XXA Other injury of unspecified body region, initial encounter: Secondary | ICD-10-CM

## 2018-05-20 DIAGNOSIS — M779 Enthesopathy, unspecified: Secondary | ICD-10-CM

## 2018-05-20 NOTE — Telephone Encounter (Signed)
I need to see her chart notes

## 2018-05-20 NOTE — Telephone Encounter (Signed)
I informed pt of Dr. Paulla Dolly assessment and orders to get MRI of right foot for posterior tibial tendon tear. Pt states she wants to have both done and wants to speak with Dr. Paulla Dolly. I told pt she could send her message through [place], and I had already asked about B/L MRI.

## 2018-05-20 NOTE — Telephone Encounter (Signed)
You can try but we don't have good documentation on the left one unless there is some from a previous note

## 2018-05-21 NOTE — Telephone Encounter (Signed)
Orders given to Gretta Arab, RN and faxed to [facility] - main scheduling.

## 2018-05-28 ENCOUNTER — Telehealth: Payer: Self-pay | Admitting: Podiatry

## 2018-05-28 NOTE — Telephone Encounter (Signed)
I'm calling to speak to the nurse about my MRI scheduled for Wednesday 03 July at [facility]. I have some concerns. One thing is that everyone I speak to at [facility] refers to my MRI for my ankle and its supposed to be for my foot. They told me to call the doctors office because they have it down as my ankle. So I wanted to see if these terms are used interchangeably, if they do the ankle with the whole foot or how do they do it? I just want to make sure that we are going to have it done of the foot. The other thing is that I was told by the person at [facility] that the doctor's office is supposed to get pre-approval from my insurance and to see if they are going to definitely cover the MRI? I did find out the expense and even with insurance it is extremely expensive. So I'm not sure I'm going to be able to have both done now because of the cost, but I still have both left and right scheduled, but I may not be able to do it since the cost is over $5,000 with my share being close to $3,000. I'm not sure if I can do both at this time. I'm not sure if different facilities charge different amounts but I was very shocked at how expensive it was at the [facility]. But anyway, my best call back number is [phone] as I will be here Friday from 9:00-6:00 and I will be here Monday and Tuesday of next week from 9:00-5:00. Thank you. Bye bye.

## 2018-05-29 NOTE — Telephone Encounter (Signed)
Pt states she is confused as to why Dr. Paulla Dolly ordered the ankle. I told pt the tendon he was evaluating is in the ankle and foot to to the navicular area and that is included in the ankle MRI. Pt asked if going to another facility for the MRI would be less costly and I told her she would need to check with her insurance. Pt states if she is to only get one foot done it would be the right it hurts the worse, and she will contact us with the facility she is to use.

## 2018-05-29 NOTE — Telephone Encounter (Signed)
Pt states she would need to call me back, she is hectic right now, and I told her I would try to get her again also.

## 2018-06-01 ENCOUNTER — Other Ambulatory Visit: Payer: Self-pay | Admitting: Podiatry

## 2018-06-01 ENCOUNTER — Telehealth: Payer: Self-pay | Admitting: Podiatry

## 2018-06-01 DIAGNOSIS — T148XXA Other injury of unspecified body region, initial encounter: Secondary | ICD-10-CM

## 2018-06-01 DIAGNOSIS — M779 Enthesopathy, unspecified: Secondary | ICD-10-CM

## 2018-06-01 DIAGNOSIS — M19072 Primary osteoarthritis, left ankle and foot: Secondary | ICD-10-CM

## 2018-06-01 NOTE — Telephone Encounter (Signed)
I'm calling about my MRI scheduled for this Wednesday at [facility]. I am going to not take that appointment, I needed to cancel which I will. But what I need to do is get it scheduled at the [facility] and I'm not sure if you have to send the request first before I can get the appointment. The reason for this is that I've been trying all last week to get some pricing and as of this morning I think I've finally figured out how much more expensive [facility] will be, so I am going to go with the imaging center. I was hoping that I would be able to get in there as soon as the first opening they have. So anyway, I can be reached at [phone] today after 11 and then all day tomorrow from 9:00 am - 6:00 pm. Thank you and I will talk to you soon. Bye bye.

## 2018-06-01 NOTE — Telephone Encounter (Signed)
Sent MRI order over to Tracey Luna for scheduling. Informed pt

## 2018-06-01 NOTE — Addendum Note (Signed)
Addended by: Clovis Riley E on: 06/01/2018 01:03 PM   Modules accepted: Orders

## 2018-06-02 ENCOUNTER — Telehealth: Payer: Self-pay | Admitting: *Deleted

## 2018-06-02 NOTE — Telephone Encounter (Signed)
"  I am calling to let you know that this patient's MRI of ankle bilateral without contrast requires authorization from [facility].  She's scheduled to have this done on Tuesday."  She's scheduled for Tuesday of next week correct?  "Yes, that is correct.  I just wanted to let you know in advance because of the upcoming holiday."

## 2018-06-03 ENCOUNTER — Ambulatory Visit (HOSPITAL_COMMUNITY): Payer: Commercial Managed Care - PPO

## 2018-06-03 ENCOUNTER — Other Ambulatory Visit (HOSPITAL_COMMUNITY): Payer: Commercial Managed Care - PPO

## 2018-06-03 ENCOUNTER — Encounter (HOSPITAL_COMMUNITY): Payer: Self-pay

## 2018-06-03 NOTE — Telephone Encounter (Signed)
I called [facility] and spoke to [employer].  She informed me that the MRI had already been authorized but the location of the facility was incorrect.  She changed it to [place].  The authorization number is [id number] and it's valid from  05/26/2018 to 06/24/2018.   I left a message for Anderson Malta at [facility] that Tracey Luna's MRI has been authorized.  The authorization number is [id number].  It is valid from 05/26/2018 to 06/24/2018.

## 2018-06-09 ENCOUNTER — Ambulatory Visit
Admission: RE | Admit: 2018-06-09 | Discharge: 2018-06-09 | Disposition: A | Discharge status: Home / Self Care | Payer: Commercial Managed Care - PPO | Source: Ambulatory Visit | Attending: Podiatry | Admitting: Podiatry

## 2018-06-09 DIAGNOSIS — M19072 Primary osteoarthritis, left ankle and foot: Secondary | ICD-10-CM

## 2018-06-09 DIAGNOSIS — M779 Enthesopathy, unspecified: Secondary | ICD-10-CM

## 2018-06-09 DIAGNOSIS — T148XXA Other injury of unspecified body region, initial encounter: Secondary | ICD-10-CM

## 2018-06-11 ENCOUNTER — Telehealth: Payer: Self-pay | Admitting: Podiatry

## 2018-06-11 NOTE — Telephone Encounter (Signed)
I was calling to see if you had any results from the MRI I had on Tuesday. If you can give me a call back on my work number at [phone]. I will be here until after 5 today and most of the day tomorrow. Thank you.

## 2018-06-11 NOTE — Telephone Encounter (Signed)
Dr. Paulla Dolly states he would like to see pt to discuss the results. I informed pt and transferred to schedulers.

## 2018-06-15 ENCOUNTER — Ambulatory Visit (INDEPENDENT_AMBULATORY_CARE_PROVIDER_SITE_OTHER): Payer: Commercial Managed Care - PPO | Admitting: Podiatry

## 2018-06-15 ENCOUNTER — Encounter: Payer: Self-pay | Admitting: Podiatry

## 2018-06-15 DIAGNOSIS — M7751 Other enthesopathy of right foot: Secondary | ICD-10-CM | POA: Diagnosis not present

## 2018-06-15 DIAGNOSIS — M779 Enthesopathy, unspecified: Secondary | ICD-10-CM

## 2018-06-15 DIAGNOSIS — T148XXA Other injury of unspecified body region, initial encounter: Secondary | ICD-10-CM

## 2018-06-15 MED ORDER — PREDNISONE 10 MG PO TABS: ORAL_TABLET | ORAL | 0 refills | Status: DC

## 2018-06-15 MED ORDER — TRIAMCINOLONE ACETONIDE 10 MG/ML IJ SUSP
10.0000 mg | Freq: Once | INTRAMUSCULAR | Status: AC
  Administered 2018-06-15: 10 mg

## 2018-06-17 NOTE — Progress Notes (Signed)
Subjective:   Patient ID: Tracey Luna, female   DOB: 57 y.o.   MRN: [id number]   HPI Patient states she is a little bit better but still having quite a bit of discomfort   ROS      Objective:  Physical Exam  Neurovascular status intact with continued inflammation around the posterior tibial tendon as it inserts into the navicular right with indications on the MRI that there is tears possibly of the peroneal tendon just inflammation of the posterior tibial     Assessment:  Tendinitis with possibility for tendon tear but it appears to be more inflammatory with findings of tear peroneal tendon bilateral that are not significantly symptomatic currently     Plan:  H&P condition reviewed and at this point I did a careful sheath injection right 3 mg Kenalog 5 mg Xylocaine and placed into an air fracture walker to completely immobilize the ankle and let it rest.  Reappoint in the next several weeks to see the results and again may ultimately require surgery but hopefully this will respond on its own

## 2018-06-29 ENCOUNTER — Ambulatory Visit (INDEPENDENT_AMBULATORY_CARE_PROVIDER_SITE_OTHER): Payer: Commercial Managed Care - PPO | Admitting: Podiatry

## 2018-06-29 ENCOUNTER — Encounter: Payer: Self-pay | Admitting: Podiatry

## 2018-06-29 DIAGNOSIS — M722 Plantar fascial fibromatosis: Secondary | ICD-10-CM

## 2018-06-29 DIAGNOSIS — M779 Enthesopathy, unspecified: Secondary | ICD-10-CM

## 2018-06-30 NOTE — Progress Notes (Signed)
Subjective:   Patient ID: Tracey Luna, female   DOB: 57 y.o.   MRN: [id number]   HPI Patient states that she is improving with diminished discomfort and states the boot helped quite a bit but she still gets some discomfort if she is not wearing proper shoes   ROS      Objective:  Physical Exam  Neurovascular status intact with significant reduction of discomfort around the posterior tibial tendon right at its insertion into the navicular     Assessment:  Significant flatfoot with structural bunion deformity with stress on the posterior tibial tendon but no indication of rupture     Plan:  Reviewed again that the important factor this point is support and I am sending her to the ped orthotist to have orthotics lifted somewhat to try to take pressure off the medial side.  We are trying to avoid surgery and she wants to do anything she can do to avoid surgery and we reviewed again the causes of her different problems and different treatment options

## 2018-09-14 ENCOUNTER — Ambulatory Visit (INDEPENDENT_AMBULATORY_CARE_PROVIDER_SITE_OTHER): Payer: Commercial Managed Care - PPO | Admitting: Otolaryngology

## 2018-09-14 DIAGNOSIS — R070 Pain in throat: Secondary | ICD-10-CM | POA: Diagnosis not present

## 2018-09-14 DIAGNOSIS — H9209 Otalgia, unspecified ear: Secondary | ICD-10-CM | POA: Diagnosis not present

## 2018-11-12 ENCOUNTER — Other Ambulatory Visit (HOSPITAL_COMMUNITY): Payer: Self-pay | Admitting: Family Medicine

## 2018-11-12 ENCOUNTER — Ambulatory Visit (HOSPITAL_COMMUNITY)
Admission: RE | Admit: 2018-11-12 | Discharge: 2018-11-12 | Disposition: A | Discharge status: Home / Self Care | Payer: Commercial Managed Care - PPO | Source: Ambulatory Visit | Attending: Family Medicine | Admitting: Family Medicine

## 2018-11-12 DIAGNOSIS — R059 Cough, unspecified: Secondary | ICD-10-CM

## 2018-11-12 DIAGNOSIS — R05 Cough: Secondary | ICD-10-CM | POA: Diagnosis present

## 2018-11-18 ENCOUNTER — Other Ambulatory Visit (HOSPITAL_COMMUNITY): Payer: Self-pay | Admitting: Family Medicine

## 2018-11-18 DIAGNOSIS — R053 Chronic cough: Secondary | ICD-10-CM

## 2018-11-18 DIAGNOSIS — R05 Cough: Secondary | ICD-10-CM

## 2018-11-23 ENCOUNTER — Ambulatory Visit (HOSPITAL_COMMUNITY): Payer: Commercial Managed Care - PPO

## 2018-12-04 ENCOUNTER — Ambulatory Visit (HOSPITAL_COMMUNITY)
Admission: RE | Admit: 2018-12-04 | Discharge: 2018-12-04 | Disposition: A | Discharge status: Home / Self Care | Payer: Commercial Managed Care - PPO | Source: Ambulatory Visit | Attending: Family Medicine | Admitting: Family Medicine

## 2018-12-04 DIAGNOSIS — R053 Chronic cough: Secondary | ICD-10-CM

## 2018-12-04 DIAGNOSIS — R05 Cough: Secondary | ICD-10-CM | POA: Insufficient documentation

## 2018-12-07 DIAGNOSIS — I77810 Thoracic aortic ectasia: Secondary | ICD-10-CM | POA: Insufficient documentation

## 2018-12-10 ENCOUNTER — Ambulatory Visit (HOSPITAL_COMMUNITY): Payer: Commercial Managed Care - PPO

## 2018-12-11 ENCOUNTER — Institutional Professional Consult (permissible substitution): Payer: Commercial Managed Care - PPO | Admitting: Pulmonary Disease

## 2018-12-14 ENCOUNTER — Ambulatory Visit (INDEPENDENT_AMBULATORY_CARE_PROVIDER_SITE_OTHER): Payer: Commercial Managed Care - PPO | Admitting: Pulmonary Disease

## 2018-12-14 ENCOUNTER — Encounter: Payer: Self-pay | Admitting: Pulmonary Disease

## 2018-12-14 VITALS — BP 134/88 | HR 83 | Ht 64.0 in | Wt 199.4 lb

## 2018-12-14 DIAGNOSIS — R05 Cough: Secondary | ICD-10-CM | POA: Diagnosis not present

## 2018-12-14 DIAGNOSIS — F17200 Nicotine dependence, unspecified, uncomplicated: Secondary | ICD-10-CM | POA: Diagnosis not present

## 2018-12-14 DIAGNOSIS — R058 Other specified cough: Secondary | ICD-10-CM

## 2018-12-14 DIAGNOSIS — J432 Centrilobular emphysema: Secondary | ICD-10-CM | POA: Diagnosis not present

## 2018-12-14 DIAGNOSIS — R059 Cough, unspecified: Secondary | ICD-10-CM

## 2018-12-14 MED ORDER — TIOTROPIUM BROMIDE MONOHYDRATE 2.5 MCG/ACT IN AERS: 2.0000 | INHALATION_SPRAY | Freq: Every day | RESPIRATORY_TRACT | 0 refills | Status: DC

## 2018-12-14 MED ORDER — ALBUTEROL SULFATE HFA 108 (90 BASE) MCG/ACT IN AERS: 2.0000 | INHALATION_SPRAY | Freq: Four times a day (QID) | RESPIRATORY_TRACT | 6 refills | Status: DC | PRN

## 2018-12-14 NOTE — Progress Notes (Signed)
Patient seen in the office today and instructed on use of Spirva.  Patient expressed understanding and demonstrated technique.

## 2018-12-14 NOTE — Progress Notes (Signed)
Synopsis: Referred in Jan 2020 for cough by Aletha Halim., PA-C  Subjective:   PATIENT ID: Tracey Luna GENDER: female DOB: September 10, 1961, MRN: [id number]  Chief Complaint  Patient presents with  . Consult    States she developed a cough 8 weeks ago. Productive cough with intermittent mucous. States her cough is worse at night. Denies chest pain. Denies SOB.     Patient complains of nonproductive cough.  Had symptoms > 8 weeks ago back in November.  The cough is non-productive, without wheezing, dyspnea or hemoptysis and is aggravated by nothing that she knows of. Patient does have pets. Patient does not have a history of asthma. Patient does have a history of environmental allergens. Patient has not recent travel. Patient does have a history of smoking. Smoked since age 34 until now, 40+ years, 0.5ppd. Her husband quit smoking 20 years ago and has been trying to get her to stop.     Past Medical History:  Diagnosis Date  . Anxiety   . Complication of anesthesia   . COPD (chronic obstructive pulmonary disease) ([place])   . GERD (gastroesophageal reflux disease)   . GERD (gastroesophageal reflux disease) 09/24/2016  . Hyperglycemia   . Hyperlipidemia   . Hypertension   . PONV (postoperative nausea and vomiting)   . Pre-diabetes   . Stroke [facility])    TIA, no deficits  . Varicose veins   . Vitamin D deficiency      Family History  Problem Relation Age of Onset  . Heart disease Mother   . Varicose Veins Mother   . Bleeding Disorder Mother   . Heart disease Father        before age 58  . Hyperlipidemia Father   . Hypertension Father   . Heart attack Father   . Heart disease Sister   . Hypertension Sister   . Diabetes Brother   . Hypertension Brother      Past Surgical History:  Procedure Laterality Date  . CHOLECYSTECTOMY    . EXCISION ORAL TUMOR Right 05/20/2017   Procedure: EXCISION RIGHT BUCCAL MUCOSAL LESION;  Surgeon: Leta Baptist, MD;  Location: [place];  Service: ENT;  Laterality: Right;    Social History   Socioeconomic History  . Marital status: Married    Spouse name: Not on file  . Number of children: Not on file  . Years of education: Not on file  . Highest education level: Not on file  Occupational History  . Not on file  Social Needs  . Financial resource strain: Not on file  . Food insecurity:    Worry: Not on file    Inability: Not on file  . Transportation needs:    Medical: Not on file    Non-medical: Not on file  Tobacco Use  . Smoking status: Current Every Day Smoker    Packs/day: 0.50    Years: 30.00    Pack years: 15.00    Types: Cigarettes  . Smokeless tobacco: Never Used  . Tobacco comment: 1/2 pack per day   Substance and Sexual Activity  . Alcohol use: Yes    Alcohol/week: 0.0 standard drinks    Comment: wine of the weekends  . Drug use: No  . Sexual activity: Not on file  Lifestyle  . Physical activity:    Days per week: Not on file    Minutes per session: Not on file  . Stress: Not on file  Relationships  . Social  connections:    Talks on phone: Not on file    Gets together: Not on file    Attends religious service: Not on file    Active member of club or organization: Not on file    Attends meetings of clubs or organizations: Not on file    Relationship status: Not on file  . Intimate partner violence:    Fear of current or ex partner: Not on file    Emotionally abused: Not on file    Physically abused: Not on file    Forced sexual activity: Not on file  Other Topics Concern  . Not on file  Social History Narrative  . Not on file     Allergies  Allergen Reactions  . Avelox [Moxifloxacin Hcl In Nacl] Rash  . Penicillins Rash  . Vancomycin Rash     Outpatient Medications Prior to Visit  Medication Sig Dispense Refill  . albuterol (PROVENTIL HFA;VENTOLIN HFA) 108 (90 Base) MCG/ACT inhaler 2 puffs every 4 to 6 hours    . atorvastatin (LIPITOR) 20 MG tablet Take 20 mg by  mouth daily.    . hydrochlorothiazide (HYDRODIURIL) 25 MG tablet Take 25 mg by mouth daily.    Marland Kitchen LORazepam (ATIVAN) 0.5 MG tablet Take 0.5 mg by mouth every 8 (eight) hours.    Salley Scarlet [facility]  Anti-Inflammatory Cream- Diclofenac 3%, Baclofen 2%, Lidocaine 2% Apply 1-2 grams to affected area 3-4 times daily Qty. 120 gm 3 refills    . omeprazole (PRILOSEC) 40 MG capsule Take 40 mg by mouth daily.    . Vitamin D, Ergocalciferol, (DRISDOL) 50000 units CAPS capsule Take by mouth.    . clindamycin (CLEOCIN) 150 MG capsule TK 4 CS PO 1 HOUR PRIOR TO DENTAL APPOINTMENT  1  . dicyclomine (BENTYL) 10 MG capsule Take 1 capsule (10 mg total) by mouth 4 (four) times daily -  before meals and at bedtime. (Patient not taking: Reported on 12/14/2018) 90 capsule 3  . doxycycline (VIBRAMYCIN) 100 MG capsule Take by mouth.    . fluconazole (DIFLUCAN) 150 MG tablet Take 150 mg by mouth daily.    . fluticasone (FLONASE) 50 MCG/ACT nasal spray Place 2 sprays into both nostrils daily.    . methylPREDNISolone (MEDROL DOSEPAK) 4 MG TBPK tablet follow package directions (Patient not taking: Reported on 12/14/2018) 21 tablet 0  . oxyCODONE-acetaminophen (ROXICET) 5-325 MG tablet Take 1 tablet by mouth every 4 (four) hours as needed for severe pain. (Patient not taking: Reported on 12/14/2018) 15 tablet 0  . predniSONE (DELTASONE) 10 MG tablet 12 day tapering dose (Patient not taking: Reported on 12/14/2018) 48 tablet 0  . traMADol (ULTRAM) 50 MG tablet Take 1 tablet (50 mg total) by mouth 3 (three) times daily. (Patient not taking: Reported on 12/14/2018) 90 tablet 2   No facility-administered medications prior to visit.     Review of Systems  Constitutional: Negative for chills, fever, malaise/fatigue and weight loss.  HENT: Negative for hearing loss, sore throat and tinnitus.   Eyes: Negative for blurred vision and double vision.  Respiratory: Positive for cough. Negative for hemoptysis, sputum  production, shortness of breath, wheezing and stridor.   Cardiovascular: Negative for chest pain, palpitations, orthopnea, leg swelling and PND.  Gastrointestinal: Negative for abdominal pain, constipation, diarrhea, heartburn, nausea and vomiting.  Genitourinary: Negative for dysuria, hematuria and urgency.  Musculoskeletal: Negative for joint pain and myalgias.  Skin: Negative for itching and rash.  Neurological: Negative for dizziness, tingling,  weakness and headaches.  Endo/Heme/Allergies: Negative for environmental allergies. Does not bruise/bleed easily.  Psychiatric/Behavioral: Negative for depression. The patient is not nervous/anxious and does not have insomnia.   All other systems reviewed and are negative.    Objective:  Physical Exam Vitals signs reviewed.  Constitutional:      General: She is not in acute distress.    Appearance: She is well-developed.  HENT:     Head: Normocephalic and atraumatic.  Eyes:     General: No scleral icterus.    Conjunctiva/sclera: Conjunctivae normal.     Pupils: Pupils are equal, round, and reactive to light.  Neck:     Musculoskeletal: Neck supple.     Vascular: No JVD.     Trachea: No tracheal deviation.  Cardiovascular:     Rate and Rhythm: Normal rate and regular rhythm.     Heart sounds: Normal heart sounds. No murmur.  Pulmonary:     Effort: Pulmonary effort is normal. No tachypnea, accessory muscle usage or respiratory distress.     Breath sounds: No stridor. No wheezing, rhonchi or rales.  Abdominal:     General: Bowel sounds are normal. There is no distension.     Palpations: Abdomen is soft.     Tenderness: There is no abdominal tenderness.  Musculoskeletal:        General: No tenderness.  Lymphadenopathy:     Cervical: No cervical adenopathy.  Skin:    General: Skin is warm and dry.     Capillary Refill: Capillary refill takes less than 2 seconds.     Findings: No rash.  Neurological:     Mental Status: She is  alert and oriented to person, place, and time.  Psychiatric:        Behavior: Behavior normal.      Vitals:   12/14/18 1425  BP: 134/88  Pulse: 83  SpO2: 97%  Weight: 199 lb 6.4 oz (90.4 kg)  Height: 5\' 4"  (1.626 m)   97% on RA BMI Readings from Last 3 Encounters:  12/14/18 34.23 kg/m  05/20/17 35.07 kg/m  09/24/16 32.80 kg/m   Wt Readings from Last 3 Encounters:  12/14/18 199 lb 6.4 oz (90.4 kg)  05/20/17 198 lb (89.8 kg)  09/24/16 197 lb 1.6 oz (89.4 kg)     CBC    Component Value Date/Time   WBC 8.4 07/21/2009 1136   RBC 4.45 07/21/2009 1136   HGB 13.9 05/20/2017 0903   HCT 41.0 05/20/2017 0903   PLT 232 07/21/2009 1136   MCV 93.3 07/21/2009 1136   MCHC 34.9 07/21/2009 1136   RDW 12.8 07/21/2009 1136    Chest Imaging: CT Chest 12/04/2018: Small areas of upper lobe centrilobular emphysema  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    Pulmonary Functions Testing Results:  No flowsheet data found.   FeNO: None   Pathology: None   Echocardiogram: None   Heart Catheterization: None     Assessment & Plan:   Cough - Plan: Pulmonary Function Test  Current smoker  Centrilobular emphysema (HCC)  Post-viral cough syndrome  Discussion:  This is a 58 year old female with a greater than 8-week history of cough associated after upper respiratory tract infection.  Likely has post viral cough syndrome.  She most likely has concomitant COPD.  She has never had any formal testing or pulmonary function test in the past.  She has very mild centrilobular emphysema noticed in the upper lobes  of her most recent CT images.  Her cough is already starting to improve after she made this appointment for follow-up.  We will recommend the following:  You must quit smoking.  We discussed several different options in quitting to include nicotine supplementation as well as tapering methods. She is not  interested in starting Chantix at this time. We will order pulmonary function test to be completed at your next office visit. Continue albuterol as needed for shortness of breath and wheezing. Will give you additional samples of Spiriva Respimat and instructed how to use of this inhaler. Pending her pulmonary function test we may make adjustments in your inhaler regimen. Please call your insurance and keep [place] aware of expensive inhaler cost is we will try to change the medications to a more affordable regimen per your insurance.  Greater than 50 minutes of this patient 60-minute office was spent face-to-face discussing the recommendations treatment plan.    Current Outpatient Medications:  .  albuterol (PROVENTIL HFA;VENTOLIN HFA) 108 (90 Base) MCG/ACT inhaler, 2 puffs every 4 to 6 hours, Disp: , Rfl:  .  atorvastatin (LIPITOR) 20 MG tablet, Take 20 mg by mouth daily., Disp: , Rfl:  .  hydrochlorothiazide (HYDRODIURIL) 25 MG tablet, Take 25 mg by mouth daily., Disp: , Rfl:  .  LORazepam (ATIVAN) 0.5 MG tablet, Take 0.5 mg by mouth every 8 (eight) hours., Disp: , Rfl:  .  NON FORMULARY, [facility]  Anti-Inflammatory Cream- Diclofenac 3%, Baclofen 2%, Lidocaine 2% Apply 1-2 grams to affected area 3-4 times daily Qty. 120 gm 3 refills, Disp: , Rfl:  .  omeprazole (PRILOSEC) 40 MG capsule, Take 40 mg by mouth daily., Disp: , Rfl:  .  Vitamin D, Ergocalciferol, (DRISDOL) 50000 units CAPS capsule, Take by mouth., Disp: , Rfl:  .  albuterol (PROVENTIL HFA;VENTOLIN HFA) 108 (90 Base) MCG/ACT inhaler, Inhale 2 puffs into the lungs every 6 (six) hours as needed for wheezing or shortness of breath., Disp: 1 Inhaler, Rfl: 6   Garner Nash, DO [facility] 12/14/2018 3:09 PM

## 2018-12-14 NOTE — Patient Instructions (Addendum)
Thank you for visiting Dr. Valeta Harms at [facility]. Today we recommend the following:  Orders Placed This Encounter  Procedures  . Pulmonary Function Test   Meds ordered this encounter  Medications  . albuterol (PROVENTIL HFA;VENTOLIN HFA) 108 (90 Base) MCG/ACT inhaler    Sig: Inhale 2 puffs into the lungs every 6 (six) hours as needed for wheezing or shortness of breath.    Dispense:  1 Inhaler    Refill:  6  . Tiotropium Bromide Monohydrate (SPIRIVA RESPIMAT) 2.5 MCG/ACT AERS    Sig: Inhale 2 puffs into the lungs daily.    Dispense:  1 Inhaler    Refill:  0   Continue your current inhaler regimen. We will teach you how to use it before you leave and give you additional samples.  Please call your insurance and keep [place] aware of expensive inhaler cost is we will try to change the medications to a more affordable regimen per your insurance.  Return in about 3 weeks (around 01/04/2019).  To have pulmonary function test completed and review these with one of our nurse practitioners.

## 2018-12-15 ENCOUNTER — Ambulatory Visit (HOSPITAL_COMMUNITY): Payer: Commercial Managed Care - PPO

## 2018-12-28 ENCOUNTER — Telehealth: Payer: Self-pay | Admitting: Pulmonary Disease

## 2018-12-28 NOTE — Telephone Encounter (Signed)
Pt is calling back [phone]  Please leave a detailed message

## 2018-12-28 NOTE — Telephone Encounter (Signed)
Pt states she would rather use Pharmacy: [place], [place] and is okay leave to detailed on cell phone.   Pt is having cough-productive-now yellow in color; cough is between her throat and chest area; denies any fever,chills, or sinus drainage.   Smoking 0.5ppd at this time; follow up with B. Icard is 01/05/2019 with PFT prior same day.   Sarah, please advise. Thanks

## 2018-12-28 NOTE — Telephone Encounter (Signed)
LMTCB x1 for pt.  

## 2018-12-28 NOTE — Telephone Encounter (Signed)
She needs to be seen. Please schedule an acute OV. Thanks

## 2018-12-28 NOTE — Telephone Encounter (Signed)
Spoke with pt. She is aware of Sarah's response. States that she can't come in for an appointment. She will wait until her appointment with Dr. Valeta Harms next week. Nothing further was needed.

## 2019-01-05 ENCOUNTER — Ambulatory Visit (INDEPENDENT_AMBULATORY_CARE_PROVIDER_SITE_OTHER): Payer: Commercial Managed Care - PPO | Admitting: Pulmonary Disease

## 2019-01-05 ENCOUNTER — Encounter: Payer: Self-pay | Admitting: Pulmonary Disease

## 2019-01-05 VITALS — BP 122/82 | HR 65 | Ht 64.0 in | Wt 198.8 lb

## 2019-01-05 DIAGNOSIS — J439 Emphysema, unspecified: Secondary | ICD-10-CM

## 2019-01-05 DIAGNOSIS — J984 Other disorders of lung: Secondary | ICD-10-CM

## 2019-01-05 DIAGNOSIS — R059 Cough, unspecified: Secondary | ICD-10-CM

## 2019-01-05 DIAGNOSIS — R05 Cough: Secondary | ICD-10-CM

## 2019-01-05 DIAGNOSIS — R0602 Shortness of breath: Secondary | ICD-10-CM

## 2019-01-05 DIAGNOSIS — J449 Chronic obstructive pulmonary disease, unspecified: Secondary | ICD-10-CM | POA: Insufficient documentation

## 2019-01-05 DIAGNOSIS — R062 Wheezing: Secondary | ICD-10-CM

## 2019-01-05 LAB — PULMONARY FUNCTION TEST
DL/VA % pred: 110 %
DL/VA: 4.68 ml/min/mmHg/L
DLCO unc % pred: 101 %
DLCO unc: 20.65 ml/min/mmHg
FEF 25-75 Pre: 1.2 L/sec
FEF2575-%Pred-Pre: 50 %
FEV1-%Pred-Pre: 57 %
FEV1-Pre: 1.49 L
FEV1FVC-%Pred-Pre: 95 %
FEV6-%Pred-Pre: 61 %
FEV6-Pre: 1.99 L
FEV6FVC-%Pred-Pre: 102 %
FVC-%Pred-Pre: 60 %
FVC-Pre: 2 L
PRE FEV6/FVC RATIO: 99 %
Pre FEV1/FVC ratio: 74 %
RV % pred: 128 %
RV: 2.53 L
TLC % pred: 97 %
TLC: 4.91 L

## 2019-01-05 MED ORDER — FLUTICASONE-UMECLIDIN-VILANT 100-62.5-25 MCG/INH IN AEPB: 1.0000 | INHALATION_SPRAY | Freq: Every day | RESPIRATORY_TRACT | 0 refills | Status: AC

## 2019-01-05 NOTE — Progress Notes (Signed)
PFT completed today.  

## 2019-01-05 NOTE — Progress Notes (Signed)
Synopsis: Referred in Jan 2020 for cough by Aletha Halim., PA-C  Subjective:   PATIENT ID: Tracey Luna GENDER: female DOB: 04/11/1961, MRN: [id number]  Chief Complaint  Patient presents with  . Follow-up    Still coughing at this time.    Patient complains of nonproductive cough.  Had symptoms > 8 weeks ago back in November.  The cough is non-productive, without wheezing, dyspnea or hemoptysis and is aggravated by nothing that she knows of. Patient does have pets. Patient does not have a history of asthma. Patient does have a history of environmental allergens. Patient has not recent travel. Patient does have a history of smoking. Smoked since age 11 until now, 40+ years, 0.5ppd. Her husband quit smoking 20 years ago and has been trying to get her to stop.   OV 01/05/2019: Patient seen today in the office for follow-up.  She was initially seen approximately 2 to 3 weeks ago for cough.  She has a longtime avid smoker.  She states that her husband quit several years ago and she really wishes that she was able to have quit when he quit.  She states that she is still smoking at least a half a pack a day sometimes more.  She states that she goes out and sits on the porch and smokes with her daughter in the evenings.  Her daughter lives with her and smokes in an outside the home.  She recently had 2 upper respiratory tach type infections and was started on azithromycin by her primary care provider.  She completes her azithromycin tomorrow.  Today she had pulmonary function test completed.  Patient's pulmonary function test reveals abnormality in spirometry, FVC 60% predicted, FEV1 57% predicted ratio is 74 with air trapping with an RV of 128% and a DLCO of 101%.  HRCT that was completed prior to the last office visit had no evidence of ILD.  Her TLC was normal.  At this time she is having significant cough.  Her cough is usually worse at night and sometimes in the mornings.  She has episodic cough  that will sometimes cause her to have posttussive emesis.  Past Medical History:  Diagnosis Date  . Anxiety   . Complication of anesthesia   . COPD (chronic obstructive pulmonary disease) ([place])   . GERD (gastroesophageal reflux disease)   . GERD (gastroesophageal reflux disease) 09/24/2016  . Hyperglycemia   . Hyperlipidemia   . Hypertension   . PONV (postoperative nausea and vomiting)   . Pre-diabetes   . Stroke [facility])    TIA, no deficits  . Varicose veins   . Vitamin D deficiency      Family History  Problem Relation Age of Onset  . Heart disease Mother   . Varicose Veins Mother   . Bleeding Disorder Mother   . Heart disease Father        before age 79  . Hyperlipidemia Father   . Hypertension Father   . Heart attack Father   . Heart disease Sister   . Hypertension Sister   . Diabetes Brother   . Hypertension Brother      Past Surgical History:  Procedure Laterality Date  . CHOLECYSTECTOMY    . EXCISION ORAL TUMOR Right 05/20/2017   Procedure: EXCISION RIGHT BUCCAL MUCOSAL LESION;  Surgeon: Leta Baptist, MD;  Location: [place];  Service: ENT;  Laterality: Right;    Social History   Socioeconomic History  . Marital status: Married  Spouse name: Not on file  . Number of children: Not on file  . Years of education: Not on file  . Highest education level: Not on file  Occupational History  . Not on file  Social Needs  . Financial resource strain: Not on file  . Food insecurity:    Worry: Not on file    Inability: Not on file  . Transportation needs:    Medical: Not on file    Non-medical: Not on file  Tobacco Use  . Smoking status: Current Every Day Smoker    Packs/day: 0.50    Years: 30.00    Pack years: 15.00    Types: Cigarettes  . Smokeless tobacco: Never Used  . Tobacco comment: 1/2 pack per day   Substance and Sexual Activity  . Alcohol use: Yes    Alcohol/week: 0.0 standard drinks    Comment: wine of the weekends  . Drug use:  No  . Sexual activity: Not on file  Lifestyle  . Physical activity:    Days per week: Not on file    Minutes per session: Not on file  . Stress: Not on file  Relationships  . Social connections:    Talks on phone: Not on file    Gets together: Not on file    Attends religious service: Not on file    Active member of club or organization: Not on file    Attends meetings of clubs or organizations: Not on file    Relationship status: Not on file  . Intimate partner violence:    Fear of current or ex partner: Not on file    Emotionally abused: Not on file    Physically abused: Not on file    Forced sexual activity: Not on file  Other Topics Concern  . Not on file  Social History Narrative  . Not on file     Allergies  Allergen Reactions  . Avelox [Moxifloxacin Hcl In Nacl] Rash  . Penicillins Rash  . Vancomycin Rash     Outpatient Medications Prior to Visit  Medication Sig Dispense Refill  . albuterol (PROVENTIL HFA;VENTOLIN HFA) 108 (90 Base) MCG/ACT inhaler 2 puffs every 4 to 6 hours    . albuterol (PROVENTIL HFA;VENTOLIN HFA) 108 (90 Base) MCG/ACT inhaler Inhale 2 puffs into the lungs every 6 (six) hours as needed for wheezing or shortness of breath. 1 Inhaler 6  . atorvastatin (LIPITOR) 20 MG tablet Take 20 mg by mouth daily.    . hydrochlorothiazide (HYDRODIURIL) 25 MG tablet Take 25 mg by mouth daily.    Marland Kitchen LORazepam (ATIVAN) 0.5 MG tablet Take 0.5 mg by mouth every 8 (eight) hours.    Salley Scarlet FOR[facility]UL[facility]  Anti-Inflammatory Cream- Diclofenac 3%, Baclofen 2%, Lidocaine 2% Apply 1-2 grams to affected area 3-4 times daily Qty. 120 gm 3 refills    . omeprazole (PRILOSEC) 40 MG capsule Take 40 mg by mouth daily.    . Tiotropium Bromide Monohydrate (SPIRIVA RESPIMAT) 2.5 MCG/ACT AERS Inhale 2 puffs into the lungs daily. 1 Inhaler 0  . Vitamin D, Ergocalciferol, (DRISDOL) 50000 units CAPS capsule Take by mouth.     No facility-administered medications prior  to visit.     Review of Systems  Constitutional: Negative for chills, fever, malaise/fatigue and weight loss.  HENT: Negative for hearing loss, sore throat and tinnitus.   Eyes: Negative for blurred vision and double vision.  Respiratory: Positive for cough, shortness of breath and wheezing. Negative for  hemoptysis, sputum production and stridor.   Cardiovascular: Negative for chest pain, palpitations, orthopnea, leg swelling and PND.  Gastrointestinal: Negative for abdominal pain, constipation, diarrhea, heartburn, nausea and vomiting.  Genitourinary: Negative for dysuria, hematuria and urgency.  Musculoskeletal: Negative for joint pain and myalgias.  Skin: Negative for itching and rash.  Neurological: Negative for dizziness, tingling, weakness and headaches.  Endo/Heme/Allergies: Negative for environmental allergies. Does not bruise/bleed easily.  Psychiatric/Behavioral: Negative for depression. The patient is not nervous/anxious and does not have insomnia.   All other systems reviewed and are negative.    Objective:  Physical Exam Vitals signs reviewed.  Constitutional:      General: She is not in acute distress.    Appearance: She is well-developed.  HENT:     Head: Normocephalic and atraumatic.     Mouth/Throat:     Pharynx: No oropharyngeal exudate.  Eyes:     Conjunctiva/sclera: Conjunctivae normal.     Pupils: Pupils are equal, round, and reactive to light.  Neck:     Vascular: No JVD.     Trachea: No tracheal deviation.  Cardiovascular:     Rate and Rhythm: Normal rate and regular rhythm.     Heart sounds: S1 normal and S2 normal.     Comments: Distant heart tones Pulmonary:     Effort: No tachypnea or accessory muscle usage.     Breath sounds: No stridor. Decreased breath sounds (throughout all lung fields) and wheezing present. No rhonchi or rales.  Abdominal:     General: Bowel sounds are normal. There is no distension.     Palpations: Abdomen is soft.      Tenderness: There is no abdominal tenderness.  Musculoskeletal:        General: No deformity.  Skin:    General: Skin is warm and dry.     Capillary Refill: Capillary refill takes less than 2 seconds.     Findings: No rash.  Neurological:     Mental Status: She is alert and oriented to person, place, and time.  Psychiatric:        Behavior: Behavior normal.      Vitals:   01/05/19 1603  BP: 122/82  Pulse: 65  SpO2: 98%  Weight: 198 lb 12.8 oz (90.2 kg)  Height: 5\' 4"  (1.626 m)   98% on RA BMI Readings from Last 3 Encounters:  01/05/19 34.12 kg/m  12/14/18 34.23 kg/m  05/20/17 35.07 kg/m   Wt Readings from Last 3 Encounters:  01/05/19 198 lb 12.8 oz (90.2 kg)  12/14/18 199 lb 6.4 oz (90.4 kg)  05/20/17 198 lb (89.8 kg)     CBC    Component Value Date/Time   WBC 8.4 07/21/2009 1136   RBC 4.45 07/21/2009 1136   HGB 13.9 05/20/2017 0903   HCT 41.0 05/20/2017 0903   PLT 232 07/21/2009 1136   MCV 93.3 07/21/2009 1136   MCHC 34.9 07/21/2009 1136   RDW 12.8 07/21/2009 1136    Chest Imaging: CT Chest 12/04/2018: Small areas of upper lobe centrilobular emphysema  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    Pulmonary Functions Testing Results:  PFT Results Latest Ref Rng & Units 01/05/2019  FVC-Pre L 2.00  FVC-Predicted Pre % 60  Pre FEV1/FVC % % 74  FEV1-Pre L 1.49  FEV1-Predicted Pre % 57  DLCO UNC% % 101  DLCO COR %Predicted % 110  TLC L 4.91  TLC % Predicted %  97  RV % Predicted % 128     FeNO: None   Pathology: None   Echocardiogram: None   Heart Catheterization: None     Assessment & Plan:   Mixed restrictive and obstructive lung disease (HCC)  SOB (shortness of breath)  Cough  Wheeze  Discussion:  This is a 58 year old female with a longstanding history of smoking and recent PFTs with evidence of mixed restrictive and obstructive lung disease due to severely  depressed spirometry both FEV1 and FVC, but has preserved ratio.  CT scan with no significant evidence of interstitial lung disease.  She also has an slightly elevated DLCO in 1 could consider a allergic/asthma type component to her disease process.  However she has not had these symptoms before as a child.  Common things being common and believe are dealing with smoking-related lung disease.  Today in the office we recommend the following: We will switch her from Spiriva Respimat and give her samples of Trelegy to see if this makes her symptoms improved. Today in the office we discussed smoking cessation. She must quit smoking.  Her symptoms will not resolve and she will continue to have them most likely if she continues to smoke. Today we discussed use of Chantix. We discussed the risk benefits and alternatives of starting Chantix and the potential side effects. We also discussed tapering methods for smoking cessation Greater than 10 minutes of this patient's office visit was spent on smoking cessation counseling.  Patient to return to clinic in 3 months hopefully smoke-free.  Greater than 50% of this patient's 25-minute office visit was been face-to-face discussing above recommendations and treatment plan as described above.   Current Outpatient Medications:  .  albuterol (PROVENTIL HFA;VENTOLIN HFA) 108 (90 Base) MCG/ACT inhaler, 2 puffs every 4 to 6 hours, Disp: , Rfl:  .  albuterol (PROVENTIL HFA;VENTOLIN HFA) 108 (90 Base) MCG/ACT inhaler, Inhale 2 puffs into the lungs every 6 (six) hours as needed for wheezing or shortness of breath., Disp: 1 Inhaler, Rfl: 6 .  atorvastatin (LIPITOR) 20 MG tablet, Take 20 mg by mouth daily., Disp: , Rfl:  .  hydrochlorothiazide (HYDRODIURIL) 25 MG tablet, Take 25 mg by mouth daily., Disp: , Rfl:  .  LORazepam (ATIVAN) 0.5 MG tablet, Take 0.5 mg by mouth every 8 (eight) hours., Disp: , Rfl:  .  NON FORMULARY, [facility]  Anti-Inflammatory Cream-  Diclofenac 3%, Baclofen 2%, Lidocaine 2% Apply 1-2 grams to affected area 3-4 times daily Qty. 120 gm 3 refills, Disp: , Rfl:  .  omeprazole (PRILOSEC) 40 MG capsule, Take 40 mg by mouth daily., Disp: , Rfl:  .  Tiotropium Bromide Monohydrate (SPIRIVA RESPIMAT) 2.5 MCG/ACT AERS, Inhale 2 puffs into the lungs daily., Disp: 1 Inhaler, Rfl: 0 .  Vitamin D, Ergocalciferol, (DRISDOL) 50000 units CAPS capsule, Take by mouth., Disp: , Rfl:    Garner Nash, DO [facility] 01/05/2019 4:07 PM

## 2019-01-05 NOTE — Patient Instructions (Addendum)
Thank you for visiting Dr. Valeta Harms at [facility]. Today we recommend the following: No orders of the defined types were placed in this encounter.  Meds ordered this encounter  Medications  . Fluticasone-Umeclidin-Vilant (TRELEGY ELLIPTA) 100-62.5-25 MCG/INH AEPB    Sig: Inhale 1 puff into the lungs daily.    Dispense:  1 each    Refill:  0    Order Specific Question:   Lot Number?    Answer:   [id number]    Order Specific Question:   Expiration Date?    Answer:   03/05/2020    Order Specific Question:   Manufacturer?    Answer:   [employer] [[id number]]    Order Specific Question:   Quantity    Answer:   1   Return in about 3 months (around 04/05/2019).

## 2019-04-08 ENCOUNTER — Telehealth: Payer: Self-pay | Admitting: *Deleted

## 2019-04-08 NOTE — Telephone Encounter (Signed)
REFERRAL SENT TO SCHEDULING AND NOTES ON FILE FROM [facility]. [place], [place]

## 2019-05-04 ENCOUNTER — Other Ambulatory Visit (HOSPITAL_COMMUNITY): Payer: Self-pay | Admitting: Family Medicine

## 2019-05-04 DIAGNOSIS — Z1231 Encounter for screening mammogram for malignant neoplasm of breast: Secondary | ICD-10-CM

## 2019-05-13 ENCOUNTER — Ambulatory Visit (HOSPITAL_COMMUNITY)
Admission: RE | Admit: 2019-05-13 | Discharge: 2019-05-13 | Disposition: A | Discharge status: Home / Self Care | Payer: Commercial Managed Care - PPO | Source: Ambulatory Visit | Attending: Family Medicine | Admitting: Family Medicine

## 2019-05-13 ENCOUNTER — Other Ambulatory Visit: Payer: Self-pay

## 2019-05-13 DIAGNOSIS — Z1231 Encounter for screening mammogram for malignant neoplasm of breast: Secondary | ICD-10-CM | POA: Diagnosis not present

## 2019-06-03 ENCOUNTER — Ambulatory Visit: Payer: Self-pay | Admitting: Cardiology

## 2019-06-09 ENCOUNTER — Encounter: Payer: Self-pay | Admitting: Cardiology

## 2019-06-10 ENCOUNTER — Other Ambulatory Visit: Payer: Self-pay

## 2019-06-10 ENCOUNTER — Ambulatory Visit (INDEPENDENT_AMBULATORY_CARE_PROVIDER_SITE_OTHER): Payer: Commercial Managed Care - PPO | Admitting: Cardiology

## 2019-06-10 ENCOUNTER — Encounter: Payer: Self-pay | Admitting: Cardiology

## 2019-06-10 VITALS — BP 152/88 | HR 84 | Ht 64.0 in | Wt 237.0 lb

## 2019-06-10 DIAGNOSIS — I2510 Atherosclerotic heart disease of native coronary artery without angina pectoris: Secondary | ICD-10-CM | POA: Diagnosis not present

## 2019-06-10 DIAGNOSIS — R011 Cardiac murmur, unspecified: Secondary | ICD-10-CM | POA: Diagnosis not present

## 2019-06-10 DIAGNOSIS — Z8679 Personal history of other diseases of the circulatory system: Secondary | ICD-10-CM

## 2019-06-10 DIAGNOSIS — I2584 Coronary atherosclerosis due to calcified coronary lesion: Secondary | ICD-10-CM

## 2019-06-10 DIAGNOSIS — I77810 Thoracic aortic ectasia: Secondary | ICD-10-CM

## 2019-06-10 DIAGNOSIS — R0789 Other chest pain: Secondary | ICD-10-CM

## 2019-06-10 DIAGNOSIS — Z720 Tobacco use: Secondary | ICD-10-CM

## 2019-06-10 DIAGNOSIS — I10 Essential (primary) hypertension: Secondary | ICD-10-CM

## 2019-06-10 MED ORDER — LOSARTAN POTASSIUM 50 MG PO TABS: 50.0000 mg | ORAL_TABLET | Freq: Every day | ORAL | 2 refills | Status: DC

## 2019-06-10 MED ORDER — ATORVASTATIN CALCIUM 40 MG PO TABS: 40.0000 mg | ORAL_TABLET | Freq: Every day | ORAL | 3 refills | Status: DC

## 2019-06-10 NOTE — Progress Notes (Signed)
Primary Physician/Referring:  Aletha Halim., PA-C  Patient ID: Tracey Luna, female    DOB: 09-Jul-1961, 58 y.o.   MRN: [id number]  Chief Complaint  Patient presents with  . Other    chest pressure   . New Patient (Initial Visit)   HPI:   Tracey Luna  is a 58 y.o. female  Caucasian female who I had last seen in 2015, she moved from [place].  In 1982 in [place], she has had TIA with right sided weakness and speech disturbance. PMH significant for hypertension, hyperglycemia, mild hyperlipidemia, mitral valve prolapse and mild aortic root dilatation and history of tobacco use disorder with centrilobular emphysema, venous insufficiency bilateral lower extremity.  On 12/04/2018, CT scan of the chest: 4.1 cm aortic root dilatation and aortic and coronary atherosclerosis.  She is now referred to me for evaluation and management of coronary atherosclerosis and also chest pain.   She has had on and off left sided sharp chest pain that has increased in frequency over the last 6 months. Occurs alot with walking across [employer] at work. Last generally for a few seconds to a minute. Has shortness of breath on exertion that is chronic. No leg swelling. No claudication, but has cramping in her legs at night. No PND or orthopnea.   She has not been able to quit smoking.  Her younger sister is also having aortic root dilation and valvular issues.   Past Medical History:  Diagnosis Date  . Anxiety   . Complication of anesthesia   . COPD (chronic obstructive pulmonary disease) ([place])   . GERD (gastroesophageal reflux disease)   . GERD (gastroesophageal reflux disease) 09/24/2016  . Hyperglycemia   . Hyperlipidemia   . Hypertension   . PONV (postoperative nausea and vomiting)   . Pre-diabetes   . Stroke [facility])    TIA, no deficits  . Varicose veins   . Vitamin D deficiency     Past Surgical History:  Procedure Laterality Date  . CHOLECYSTECTOMY    . EXCISION ORAL TUMOR Right  05/20/2017   Procedure: EXCISION RIGHT BUCCAL MUCOSAL LESION;  Surgeon: Leta Baptist, MD;  Location: [place];  Service: ENT;  Laterality: Right;    Social History   Socioeconomic History  . Marital status: Married    Spouse name: Not on file  . Number of children: 3  . Years of education: Not on file  . Highest education level: Not on file  Occupational History  . Not on file  Social Needs  . Financial resource strain: Not on file  . Food insecurity    Worry: Not on file    Inability: Not on file  . Transportation needs    Medical: Not on file    Non-medical: Not on file  Tobacco Use  . Smoking status: Current Every Day Smoker    Packs/day: 0.50    Years: 30.00    Pack years: 15.00    Types: Cigarettes  . Smokeless tobacco: Never Used  . Tobacco comment: 1/2 pack per day   Substance and Sexual Activity  . Alcohol use: Yes    Alcohol/week: 0.0 standard drinks    Comment: occ  . Drug use: No  . Sexual activity: Not on file  Lifestyle  . Physical activity    Days per week: Not on file    Minutes per session: Not on file  . Stress: Not on file  Relationships  . Social connections  Talks on phone: Not on file    Gets together: Not on file    Attends religious service: Not on file    Active member of club or organization: Not on file    Attends meetings of clubs or organizations: Not on file    Relationship status: Not on file  . Intimate partner violence    Fear of current or ex partner: Not on file    Emotionally abused: Not on file    Physically abused: Not on file    Forced sexual activity: Not on file  Other Topics Concern  . Not on file  Social History Narrative  . Not on file   Review of systems   Review of Systems  Constitution: Negative for decreased appetite, malaise/fatigue, weight gain and weight loss.  Eyes: Negative for visual disturbance.  Cardiovascular: Positive for chest pain and dyspnea on exertion. Negative for claudication,  leg swelling, orthopnea, palpitations and syncope.  Respiratory: Negative for hemoptysis and wheezing.   Endocrine: Negative for cold intolerance and heat intolerance.  Hematologic/Lymphatic: Negative for bleeding problem. Does not bruise/bleed easily.  Skin: Negative for nail changes.  Musculoskeletal: Negative for muscle weakness and myalgias.  Gastrointestinal: Negative for abdominal pain, change in bowel habit, nausea and vomiting.  Neurological: Negative for difficulty with concentration, dizziness, focal weakness and headaches.  Psychiatric/Behavioral: Negative for altered mental status and suicidal ideas.  All other systems reviewed and are negative.    Objective  Blood pressure (!) 152/88, pulse 84, height '5\' 4"'  (1.626 m), weight 237 lb (107.5 kg), SpO2 97 %. Body mass index is 40.68 kg/m.    Physical Exam  Constitutional: She is oriented to person, place, and time. Vital signs are normal. She appears well-developed and well-nourished.  HENT:  Head: Normocephalic and atraumatic.  Neck: Normal range of motion.  Cardiovascular: Normal rate, regular rhythm and intact distal pulses.  Murmur heard.  Harsh midsystolic murmur is present with a grade of 2/6 at the upper right sternal border and apex radiating to the neck. Pulmonary/Chest: Effort normal and breath sounds normal. No accessory muscle usage. No respiratory distress.  Abdominal: Soft. Bowel sounds are normal.  Musculoskeletal: Normal range of motion.  Neurological: She is alert and oriented to person, place, and time.  Skin: Skin is warm and dry.  Vitals reviewed.  Radiology:  CT of chest 12/04/2018:  No acute disease.  The ascending thoracic aorta measures 4.1 cm in diameter. Recommend annual imaging followup by CTA or MRA.   Aortic Atherosclerosis (ICD10-I70.0) and Emphysema  Laboratory examination:   Lab 11/11/2018: cholesterol 179, triglycerides132, HDL 47, LDL 106. HgbA1c 6.6%. Na 145, eGFR 90, potassium  3.9, CMP otherwise normal. TSH normal.   CMP Latest Ref Rng & Units 05/20/2017 07/21/2009  Glucose 65 - 99 mg/dL 148(H) 103(H)  BUN 6 - 20 mg/dL 17 11  Creatinine 0.44 - 1.00 mg/dL 0.70 0.75  Sodium 135 - 145 mmol/L 139 139  Potassium 3.5 - 5.1 mmol/L 3.6 3.8  Chloride 101 - 111 mmol/L 101 103  CO2 19 - 32 mEq/L - 28  Calcium 8.4 - 10.5 mg/dL - 10.1   CBC Latest Ref Rng & Units 05/20/2017 07/21/2009  WBC 4.0 - 10.5 K/uL - 8.4  Hemoglobin 12.0 - 15.0 g/dL 13.9 14.5  Hematocrit 36.0 - 46.0 % 41.0 41.5  Platelets 150 - 400 K/uL - 232   Lipid Panel  No results found for: CHOL, TRIG, HDL, CHOLHDL, VLDL, LDLCALC, LDLDIRECT HEMOGLOBIN A1C No results found  for: HGBA1C, MPG TSH No results for input(s): TSH in the last 8760 hours.   Medications   Current Outpatient Medications  Medication Instructions  . albuterol (PROVENTIL HFA;VENTOLIN HFA) 108 (90 Base) MCG/ACT inhaler 2 puffs every 4 to 6 hours  . atorvastatin (LIPITOR) 40 mg, Oral, Daily  . Fluticasone-Umeclidin-Vilant (TRELEGY ELLIPTA) 100-62.5-25 MCG/INH AEPB 1 puff, Inhalation, Daily  . hydrochlorothiazide (HYDRODIURIL) 25 mg, Oral, Daily  . LORazepam (ATIVAN) 0.5 mg, Oral, As needed  . losartan (COZAAR) 50 mg, Oral, Daily  . NON FORMULARY [facility] Anti-Inflammatory Cream-Diclofenac 3%, Baclofen 2%, Lidocaine 2%Apply 1-2 grams to affected area 3-4 times dailyQty. 120 gm3 refills   . omeprazole (PRILOSEC) 40 mg, Oral, Daily    Cardiac Studies:   Echo 07/27/12:  1. Left ventricular cavity is normal in size. Mild concentric hypertrophy. Normal diastolic filling. Normal global wall motion. Normal systolic global function. Visual EF is 55-60%. 2. Mild prolapse of the posterior leaflet of the mitral valve. No significant myxomatous degeneration noted. Mild mitral regurgitation.  Exercise sestamibi 08/13/12: Ex time 8:26 min, 10 METS, Chest pain. No EKG change. Normal perfusion.  Assessment     ICD-10-CM   1. Coronary  atherosclerosis due to calcified coronary lesion  I25.10 PCV MYOCARDIAL PERFUSION WITH LEXISCAN   I25.84 PCV ECHOCARDIOGRAM COMPLETE   12/04/2018, CT scan of the chest: 4.1 cm aortic root dilatation and aortic and coronary atherosclerosis  2. Aortic root dilatation (HCC)  I77.810 PCV ECHOCARDIOGRAM COMPLETE  3. Atypical chest pain  R07.89 EKG 12-Lead    PCV MYOCARDIAL PERFUSION WITH LEXISCAN  4. Essential hypertension  I10 PCV ECHOCARDIOGRAM COMPLETE  5. Tobacco use  Z72.0   6. History of bacterial endocarditis  Z86.79    1982 in [place]. At the same time, she has had TIA with right sided weakness and speech disturbance.    EKG 06/10/2019: Normal sinus rhythm at 76 bpm, normal axis, PRWP, cannot exclude anterior infarct old. IRBBB. No evidence of ischemia.   Recommendations:   Patient with hypertension, hyeprlipidemia, hyperglycemia, COPD, type 2 diabetes controlled by diet, ongoing tobacco use, mitral valve prolapse, referred to [place] for coronary calcification and aortic root dilation on recent CT scan of the chest.  Patient is having symptoms of atypical chest pain. She does have coronary calcification and aortic atherosclerosis on CT scan that is related to her smoking and risk factors. Although her symptoms are atypical, in view of her risk factors and CT scan findings, would recommend evaluation with lexiscan nuclear stress testing. Would recommend echocardiogram to evaluate aortic root size by echo and also for evaluation of systolic murmur. Has history of mitral valve prolapse with mild MR. Blood pressure is elevated today and in view of aortic root dilation, will start Losartan 50 mg daily. I have reviewed her labs from Jan, lipids are not well controlled in view of her risk factors. I will increase her lipitor to 40 mg daily. She reports PCP is to repeat labs in the next few weeks, would recommend that she wait for 8 weeks to follow up with increased dose of lipitor. She will need BMP in 2  weeks to follow up on kidney function with addition of Losartan.   Extensive discussion with the patient regarding the importance of smoking cessation. I have discussed options of medical therapy to help with this. Patient wishes to work on this on her own. If she continues to have difficulty with quitting at her next office visit, will consider medication. I will  see her back in 4 weeks for follow up to discuss results.    *I have discussed this case with Dr. Einar Gip and he personally examined the patient and participated in formulating the plan.*   Miquel Dunn, MSN, APRN, FNP-C [facility]. [place] Office: [phone] Fax: [phone]

## 2019-06-13 ENCOUNTER — Encounter: Payer: Self-pay | Admitting: Cardiology

## 2019-06-14 ENCOUNTER — Telehealth: Payer: Self-pay

## 2019-06-14 NOTE — Telephone Encounter (Signed)
Pt called stating that you started her on Losartan and it states to consult physician before alcohol consumption. Please advise if ok for pt to drink.//ah

## 2019-06-14 NOTE — Telephone Encounter (Signed)
How much alcohol are we talking about, 1 glass of wine or occasionally more is not a contraindication. Obviously heavy consumption is not allowed.

## 2019-06-15 NOTE — Telephone Encounter (Signed)
LMOM advising.//ah

## 2019-06-16 ENCOUNTER — Ambulatory Visit (INDEPENDENT_AMBULATORY_CARE_PROVIDER_SITE_OTHER): Payer: Commercial Managed Care - PPO

## 2019-06-16 ENCOUNTER — Other Ambulatory Visit: Payer: Self-pay

## 2019-06-16 DIAGNOSIS — I77810 Thoracic aortic ectasia: Secondary | ICD-10-CM | POA: Diagnosis not present

## 2019-06-16 DIAGNOSIS — I2510 Atherosclerotic heart disease of native coronary artery without angina pectoris: Secondary | ICD-10-CM

## 2019-06-16 DIAGNOSIS — I2584 Coronary atherosclerosis due to calcified coronary lesion: Secondary | ICD-10-CM

## 2019-06-16 DIAGNOSIS — I10 Essential (primary) hypertension: Secondary | ICD-10-CM

## 2019-06-16 NOTE — Telephone Encounter (Signed)
Please respond

## 2019-06-17 NOTE — Telephone Encounter (Signed)
Please respond to patient

## 2019-06-18 NOTE — Telephone Encounter (Signed)
Please respond to patient

## 2019-06-28 ENCOUNTER — Ambulatory Visit (INDEPENDENT_AMBULATORY_CARE_PROVIDER_SITE_OTHER): Payer: Commercial Managed Care - PPO

## 2019-06-28 ENCOUNTER — Other Ambulatory Visit: Payer: Self-pay

## 2019-06-28 DIAGNOSIS — I2584 Coronary atherosclerosis due to calcified coronary lesion: Secondary | ICD-10-CM

## 2019-06-28 DIAGNOSIS — I2510 Atherosclerotic heart disease of native coronary artery without angina pectoris: Secondary | ICD-10-CM

## 2019-06-28 DIAGNOSIS — R0789 Other chest pain: Secondary | ICD-10-CM

## 2019-07-09 ENCOUNTER — Ambulatory Visit: Payer: Commercial Managed Care - PPO | Admitting: Cardiology

## 2019-07-12 ENCOUNTER — Ambulatory Visit: Payer: Commercial Managed Care - PPO | Admitting: Cardiology

## 2019-07-15 ENCOUNTER — Ambulatory Visit: Payer: Commercial Managed Care - PPO | Admitting: Cardiology

## 2019-08-02 DIAGNOSIS — M25512 Pain in left shoulder: Secondary | ICD-10-CM | POA: Insufficient documentation

## 2019-08-05 ENCOUNTER — Other Ambulatory Visit: Payer: Self-pay

## 2019-08-05 ENCOUNTER — Encounter: Payer: Self-pay | Admitting: Cardiology

## 2019-08-05 ENCOUNTER — Ambulatory Visit (INDEPENDENT_AMBULATORY_CARE_PROVIDER_SITE_OTHER): Payer: Commercial Managed Care - PPO | Admitting: Cardiology

## 2019-08-05 DIAGNOSIS — I77810 Thoracic aortic ectasia: Secondary | ICD-10-CM

## 2019-08-05 DIAGNOSIS — I2510 Atherosclerotic heart disease of native coronary artery without angina pectoris: Secondary | ICD-10-CM | POA: Diagnosis not present

## 2019-08-05 DIAGNOSIS — I2584 Coronary atherosclerosis due to calcified coronary lesion: Secondary | ICD-10-CM

## 2019-08-05 DIAGNOSIS — E785 Hyperlipidemia, unspecified: Secondary | ICD-10-CM

## 2019-08-05 DIAGNOSIS — I10 Essential (primary) hypertension: Secondary | ICD-10-CM

## 2019-08-05 NOTE — Progress Notes (Signed)
Primary Physician/Referring:  Aletha Halim., PA-C  Patient ID: Tracey Luna, female    DOB: [birth year], 58 y.o.   MRN: [id number]  Chief Complaint  Patient presents with  . Hypertension  . Follow-up  . Chest Pain   HPI:   Tracey Luna  is a 58 y.o. female  Caucasian female who I had last seen in 2015, she moved from [place].  In 1982 in [place], she has had TIA with right sided weakness and speech disturbance. PMH significant for hypertension, hyperglycemia, mild hyperlipidemia, mitral valve prolapse and mild aortic root dilatation and history of tobacco use disorder with centrilobular emphysema, venous insufficiency bilateral lower extremity.  On 12/04/2018, CT scan of the chest: 4.1 cm aortic root dilatation and aortic and coronary atherosclerosis.  She is now referred to me for evaluation and management of coronary atherosclerosis and also chest pain.   She underwent Lexiscan nuclear stress testing and echocardiogram and now presents to discuss results.  Losartan was started at her last office visit for better blood pressure control.  Lipitor was also increased to 40 mg daily for improved LDL reduction.  She is tolerating medications well and states her blood pressure has been better controlled, but she admits to not checking recently.  She did not have a way to check her blood pressure today.  She has not had any further issues with chest pain.  Dyspnea on exertion remains stable.  She is having significant left shoulder pain potentially related to bursitis and states she underwent injection today.  Overall she is feeling well.  Past Medical History:  Diagnosis Date  . Anxiety   . Complication of anesthesia   . COPD (chronic obstructive pulmonary disease) ([place])   . GERD (gastroesophageal reflux disease)   . GERD (gastroesophageal reflux disease) 09/24/2016  . Hyperglycemia   . Hyperlipidemia   . Hypertension   . PONV (postoperative nausea and vomiting)   . Pre-diabetes    . Stroke [facility])    TIA, no deficits  . Varicose veins   . Vitamin D deficiency     Past Surgical History:  Procedure Laterality Date  . CHOLECYSTECTOMY    . EXCISION ORAL TUMOR Right 05/20/2017   Procedure: EXCISION RIGHT BUCCAL MUCOSAL LESION;  Surgeon: Leta Baptist, MD;  Location: [facility];  Service: ENT;  Laterality: Right;    Social History   Socioeconomic History  . Marital status: Married    Spouse name: Not on file  . Number of children: 3  . Years of education: Not on file  . Highest education level: Not on file  Occupational History  . Not on file  Social Needs  . Financial resource strain: Not on file  . Food insecurity    Worry: Not on file    Inability: Not on file  . Transportation needs    Medical: Not on file    Non-medical: Not on file  Tobacco Use  . Smoking status: Current Every Day Smoker    Packs/day: 0.50    Years: 30.00    Pack years: 15.00    Types: Cigarettes  . Smokeless tobacco: Never Used  . Tobacco comment: 1/2 pack per day   Substance and Sexual Activity  . Alcohol use: Yes    Alcohol/week: 0.0 standard drinks    Comment: occ  . Drug use: No  . Sexual activity: Not on file  Lifestyle  . Physical activity    Days per week: Not on  file    Minutes per session: Not on file  . Stress: Not on file  Relationships  . Social Herbalist on phone: Not on file    Gets together: Not on file    Attends religious service: Not on file    Active member of club or organization: Not on file    Attends meetings of clubs or organizations: Not on file    Relationship status: Not on file  . Intimate partner violence    Fear of current or ex partner: Not on file    Emotionally abused: Not on file    Physically abused: Not on file    Forced sexual activity: Not on file  Other Topics Concern  . Not on file  Social History Narrative  . Not on file   Review of systems   Review of Systems  Constitution: Negative for  decreased appetite, malaise/fatigue, weight gain and weight loss.  Eyes: Negative for visual disturbance.  Cardiovascular: Positive for dyspnea on exertion. Negative for chest pain, claudication, leg swelling, orthopnea, palpitations and syncope.  Respiratory: Negative for hemoptysis and wheezing.   Endocrine: Negative for cold intolerance and heat intolerance.  Hematologic/Lymphatic: Negative for bleeding problem. Does not bruise/bleed easily.  Skin: Negative for nail changes.  Musculoskeletal: Positive for joint pain (left shoulder pain). Negative for muscle weakness and myalgias.  Gastrointestinal: Negative for abdominal pain, change in bowel habit, nausea and vomiting.  Neurological: Negative for difficulty with concentration, dizziness, focal weakness and headaches.  Psychiatric/Behavioral: Negative for altered mental status and suicidal ideas.  All other systems reviewed and are negative.    Objective  There were no vitals taken for this visit. There is no height or weight on file to calculate BMI.    Physical exam not performed or limited due to virtual visit.  Patient appeared to be in no distress, Neck was supple, respiration was not labored.  Please see exam details from prior visit is as below.   Physical Exam  Constitutional: She is oriented to person, place, and time. Vital signs are normal. She appears well-developed and well-nourished.  HENT:  Head: Normocephalic and atraumatic.  Neck: Normal range of motion.  Cardiovascular: Normal rate, regular rhythm and intact distal pulses.  Murmur heard.  Harsh midsystolic murmur is present with a grade of 2/6 at the upper right sternal border and apex radiating to the neck. Pulmonary/Chest: Effort normal and breath sounds normal. No accessory muscle usage. No respiratory distress.  Abdominal: Soft. Bowel sounds are normal.  Musculoskeletal: Normal range of motion.  Neurological: She is alert and oriented to person, place, and  time.  Skin: Skin is warm and dry.  Vitals reviewed.  Radiology:  CT of chest 12/04/2018:  No acute disease.  The ascending thoracic aorta measures 4.1 cm in diameter. Recommend annual imaging followup by CTA or MRA.   Aortic Atherosclerosis (ICD10-I70.0) and Emphysema  Laboratory examination:   Lab 11/11/2018: cholesterol 179, triglycerides132, HDL 47, LDL 106. HgbA1c 6.6%. Na 145, eGFR 90, potassium 3.9, CMP otherwise normal. TSH normal.   CMP Latest Ref Rng & Units 05/20/2017 07/21/2009  Glucose 65 - 99 mg/dL 148(H) 103(H)  BUN 6 - 20 mg/dL 17 11  Creatinine 0.44 - 1.00 mg/dL 0.70 0.75  Sodium 135 - 145 mmol/L 139 139  Potassium 3.5 - 5.1 mmol/L 3.6 3.8  Chloride 101 - 111 mmol/L 101 103  CO2 19 - 32 mEq/L - 28  Calcium 8.4 - 10.5 mg/dL - 10.1  CBC Latest Ref Rng & Units 05/20/2017 07/21/2009  WBC 4.0 - 10.5 K/uL - 8.4  Hemoglobin 12.0 - 15.0 g/dL 13.9 14.5  Hematocrit 36.0 - 46.0 % 41.0 41.5  Platelets 150 - 400 K/uL - 232   Lipid Panel  No results found for: CHOL, TRIG, HDL, CHOLHDL, VLDL, LDLCALC, LDLDIRECT HEMOGLOBIN A1C No results found for: HGBA1C, MPG TSH No results for input(s): TSH in the last 8760 hours.   Medications   Current Outpatient Medications  Medication Instructions  . albuterol (PROVENTIL HFA;VENTOLIN HFA) 108 (90 Base) MCG/ACT inhaler 2 puffs every 4 to 6 hours  . atorvastatin (LIPITOR) 40 mg, Oral, Daily  . Fluticasone-Umeclidin-Vilant (TRELEGY ELLIPTA) 100-62.5-25 MCG/INH AEPB 1 puff, Inhalation, Daily  . hydrochlorothiazide (HYDRODIURIL) 25 mg, Oral, Daily  . LORazepam (ATIVAN) 0.5 mg, Oral, As needed  . losartan (COZAAR) 50 mg, Oral, Daily  . NON FORMULARY [facility] Anti-Inflammatory Cream-Diclofenac 3%, Baclofen 2%, Lidocaine 2%Apply 1-2 grams to affected area 3-4 times dailyQty. 120 gm3 refills   . omeprazole (PRILOSEC) 40 mg, Oral, Daily    Cardiac Studies:   Echocardiogram 06/16/2019: Normal LV systolic function with EF  56%. Left ventricle cavity is normal in size. Mild concentric hypertrophy of the left ventricle. Normal global wall motion. Doppler evidence of grade I (impaired) diastolic dysfunction, normal LAP.  Mild (Grade I) mitral regurgitation. No significant mitral valve prolapse noted. Mild tricuspid regurgitation. Estimated pulmonary artery systolic pressure is 23 mmHg. No significant change noted compared to previous study in 2013.   Lexiscan Sestamibi Stress Test 06/28/2019: Stress EKG is non-diagnostic, as this is pharmacological stress test. Myocardial perfusion imaging is normal. Left ventricular ejection fraction is  73% with normal wall motion. Low risk study.  Assessment     ICD-10-CM   1. Coronary atherosclerosis due to calcified coronary lesion  I25.10    I25.84   2. Essential hypertension  I10   3. Aortic root dilatation (HCC)  I77.810   4. Hyperlipidemia LDL goal <70  E78.5     EKG 06/10/2019: Normal sinus rhythm at 76 bpm, normal axis, PRWP, cannot exclude anterior infarct old. IRBBB. No evidence of ischemia.   Recommendations:   Patient with hypertension, hyeprlipidemia, hyperglycemia, COPD, type 2 diabetes controlled by diet, ongoing tobacco use, mitral valve prolapse, recently evaluated by [place] for coronary calcification and aortic root dilation on recent CT scan of the chest.  I discussed recently obtained Lexiscan nuclear stress test results with patient, low risk study with normal perfusion.  She has not had any further symptoms of atypical chest pain, suspect related to hypertension.  Echocardiogram was unchanged compared to 2013.  Has normal LVEF with mild blood pressure changes with mild LVH and diastolic dysfunction.  She has mild MR, but previously noted mitral valve prolapse was not noted.  Aortic root diameter measured at 3.7 cm, although was measured at 4.1 cm by CT scan of the chest.  Will need continued surveillance.  Her blood pressure is elevated today, but states  that she is in some pain from obtaining left shoulder injection which has significantly been bothering her.  She has not been monitoring her blood pressure regularly, but 1 month ago was much better controlled with recent medication changes.  I have asked her to start monitoring her blood pressure for the next 5 days to notify me of the readings and will decide on if she needs adjustment of her medications at that time.  Lipitor was increased to 40 mg daily at her  last office visit.  She will need repeat lipids for surveillance, that she will have performed at her work.  She is tolerating increased dose of Lipitor well.  I would recommend a goal of LDL less than 70.  Unless her blood pressure continues to be uncontrolled, I will see her back in 6 months for follow-up on hypertension and aortic root dilation.  She will continue to need to work towards smoking cessation, I have stressed the importance of this.  Miquel Dunn, MSN, APRN, FNP-C [facility]. [place] Office: [phone] Fax: [phone]

## 2019-08-11 ENCOUNTER — Other Ambulatory Visit: Payer: Self-pay | Admitting: Cardiology

## 2019-08-11 MED ORDER — LOSARTAN POTASSIUM 100 MG PO TABS: 100.0000 mg | ORAL_TABLET | Freq: Every day | ORAL | 2 refills | Status: DC

## 2019-08-11 NOTE — Telephone Encounter (Signed)
Please read

## 2019-08-30 NOTE — Progress Notes (Signed)
08/25/2019: CBC normal.  Creatinine 0.76, EGFR 86, potassium 4.5, CMP normal.  Cholesterol 185, triglycerides 104, HDL 55, LDL 111.  Hemoglobin A1c 7.4%.  TSH normal.

## 2019-08-31 NOTE — Telephone Encounter (Signed)
Please read

## 2019-09-03 DIAGNOSIS — E139 Other specified diabetes mellitus without complications: Secondary | ICD-10-CM | POA: Insufficient documentation

## 2019-09-06 ENCOUNTER — Ambulatory Visit (INDEPENDENT_AMBULATORY_CARE_PROVIDER_SITE_OTHER): Payer: Commercial Managed Care - PPO | Admitting: Podiatry

## 2019-09-06 ENCOUNTER — Ambulatory Visit (INDEPENDENT_AMBULATORY_CARE_PROVIDER_SITE_OTHER): Payer: Commercial Managed Care - PPO

## 2019-09-06 ENCOUNTER — Other Ambulatory Visit: Payer: Self-pay

## 2019-09-06 ENCOUNTER — Encounter: Payer: Self-pay | Admitting: Podiatry

## 2019-09-06 DIAGNOSIS — M2041 Other hammer toe(s) (acquired), right foot: Secondary | ICD-10-CM

## 2019-09-06 DIAGNOSIS — M779 Enthesopathy, unspecified: Secondary | ICD-10-CM | POA: Diagnosis not present

## 2019-09-06 DIAGNOSIS — M7751 Other enthesopathy of right foot: Secondary | ICD-10-CM

## 2019-09-06 DIAGNOSIS — M21619 Bunion of unspecified foot: Secondary | ICD-10-CM

## 2019-09-06 NOTE — Progress Notes (Signed)
Subjective:   Patient ID: Tracey Luna, female   DOB: 58 y.o.   MRN: [id number]   HPI Patient presents with a lot of pain in the outside of the right foot and states that she knows that she needs to get her bunion fixed and her toes are lifting in the air more than they were previously   ROS      Objective:  Physical Exam  What appears to be acute peroneal tendinitis right with inflammation along with significant structural bunion deformity right and hammertoe deformity with rigid contracture digits 2 3     Assessment:  Peroneal tendinitis right at the insertion along with severe structural bunion deformity digital deformity     Plan:  H&P x-ray reviewed and sterile prep and injected the lateral peroneal complex 3 mg Dexasone Kenalog 5 mg Xylocaine and discussed bunion would recommend Lapidus type fusion along with possible Akin osteotomy digital fusions.  Patient will be seen back to recheck  X-ray indicates severe bunion deformity with digital deformities noted

## 2019-09-21 ENCOUNTER — Other Ambulatory Visit: Payer: Self-pay | Admitting: *Deleted

## 2019-09-21 DIAGNOSIS — Z20822 Contact with and (suspected) exposure to covid-19: Secondary | ICD-10-CM

## 2019-09-22 LAB — NOVEL CORONAVIRUS, NAA: SARS-CoV-2, NAA: NOT DETECTED

## 2019-11-10 ENCOUNTER — Other Ambulatory Visit: Payer: Self-pay

## 2019-11-10 MED ORDER — LOSARTAN POTASSIUM 100 MG PO TABS: 100.0000 mg | ORAL_TABLET | Freq: Every day | ORAL | 1 refills | Status: DC

## 2019-11-10 NOTE — Telephone Encounter (Signed)
Per pt

## 2019-12-09 ENCOUNTER — Other Ambulatory Visit (HOSPITAL_COMMUNITY): Payer: Self-pay | Admitting: Family Medicine

## 2019-12-09 ENCOUNTER — Other Ambulatory Visit: Payer: Self-pay | Admitting: Family Medicine

## 2019-12-09 DIAGNOSIS — I77810 Thoracic aortic ectasia: Secondary | ICD-10-CM

## 2019-12-14 ENCOUNTER — Ambulatory Visit (HOSPITAL_COMMUNITY): Payer: Commercial Managed Care - PPO

## 2019-12-17 ENCOUNTER — Encounter (HOSPITAL_COMMUNITY): Payer: Self-pay

## 2019-12-17 ENCOUNTER — Ambulatory Visit (HOSPITAL_COMMUNITY): Payer: Commercial Managed Care - PPO

## 2019-12-30 ENCOUNTER — Other Ambulatory Visit (HOSPITAL_COMMUNITY): Payer: Self-pay | Admitting: Family Medicine

## 2019-12-30 DIAGNOSIS — E2839 Other primary ovarian failure: Secondary | ICD-10-CM

## 2019-12-31 ENCOUNTER — Ambulatory Visit: Payer: Commercial Managed Care - PPO | Attending: Internal Medicine

## 2019-12-31 ENCOUNTER — Other Ambulatory Visit: Payer: Self-pay

## 2019-12-31 DIAGNOSIS — Z20822 Contact with and (suspected) exposure to covid-19: Secondary | ICD-10-CM

## 2020-01-01 LAB — NOVEL CORONAVIRUS, NAA: SARS-CoV-2, NAA: NOT DETECTED

## 2020-01-05 ENCOUNTER — Ambulatory Visit (HOSPITAL_COMMUNITY): Payer: Commercial Managed Care - PPO

## 2020-01-07 ENCOUNTER — Ambulatory Visit (HOSPITAL_COMMUNITY)
Admission: RE | Admit: 2020-01-07 | Discharge: 2020-01-07 | Disposition: A | Discharge status: Home / Self Care | Payer: Commercial Managed Care - PPO | Source: Ambulatory Visit | Attending: Family Medicine | Admitting: Family Medicine

## 2020-01-07 ENCOUNTER — Other Ambulatory Visit: Payer: Self-pay

## 2020-01-07 DIAGNOSIS — E2839 Other primary ovarian failure: Secondary | ICD-10-CM | POA: Diagnosis not present

## 2020-01-26 ENCOUNTER — Ambulatory Visit (HOSPITAL_COMMUNITY): Payer: Commercial Managed Care - PPO

## 2020-01-28 ENCOUNTER — Other Ambulatory Visit: Payer: Self-pay

## 2020-01-28 MED ORDER — HYDROCHLOROTHIAZIDE 25 MG PO TABS: 25.0000 mg | ORAL_TABLET | Freq: Every day | ORAL | 1 refills | Status: DC

## 2020-01-28 NOTE — Telephone Encounter (Signed)
Done

## 2020-01-28 NOTE — Telephone Encounter (Signed)
From patient.

## 2020-02-02 ENCOUNTER — Ambulatory Visit (HOSPITAL_COMMUNITY): Admission: RE | Admit: 2020-02-02 | Payer: Commercial Managed Care - PPO | Source: Ambulatory Visit

## 2020-02-02 ENCOUNTER — Encounter (HOSPITAL_COMMUNITY): Payer: Self-pay

## 2020-02-03 ENCOUNTER — Ambulatory Visit: Payer: Commercial Managed Care - PPO

## 2020-02-03 ENCOUNTER — Ambulatory Visit: Payer: Commercial Managed Care - PPO | Attending: Internal Medicine

## 2020-02-03 DIAGNOSIS — Z23 Encounter for immunization: Secondary | ICD-10-CM | POA: Insufficient documentation

## 2020-02-03 NOTE — Progress Notes (Signed)
   Covid-19 Vaccination Clinic  Name:  Tracey Luna    MRN: [id number] DOB: 09-Oct-1961  02/03/2020  Tracey Luna was observed post Covid-19 immunization for 15 minutes without incident. She was provided with Vaccine Information Sheet and instruction to access the V-Safe system.   Tracey Luna was instructed to call 911 with any severe reactions post vaccine: Marland Kitchen Difficulty breathing  . Swelling of face and throat  . A fast heartbeat  . A bad rash all over body  . Dizziness and weakness   Immunizations Administered    Name Date Dose VIS Date Route   Moderna COVID-19 Vaccine 02/03/2020 11:43 AM 0.5 mL 11/02/2019 Intramuscular   Manufacturer: Moderna   Lot: [id number]   [place]:[id number]

## 2020-02-09 ENCOUNTER — Other Ambulatory Visit: Payer: Self-pay | Admitting: Cardiology

## 2020-02-23 ENCOUNTER — Encounter (HOSPITAL_COMMUNITY): Payer: Self-pay

## 2020-02-23 ENCOUNTER — Ambulatory Visit (HOSPITAL_COMMUNITY): Payer: Commercial Managed Care - PPO

## 2020-02-23 ENCOUNTER — Other Ambulatory Visit: Payer: Self-pay

## 2020-02-23 ENCOUNTER — Ambulatory Visit (HOSPITAL_COMMUNITY)
Admission: RE | Admit: 2020-02-23 | Discharge: 2020-02-23 | Disposition: A | Discharge status: Home / Self Care | Payer: Commercial Managed Care - PPO | Source: Ambulatory Visit | Attending: Family Medicine | Admitting: Family Medicine

## 2020-02-23 DIAGNOSIS — I77810 Thoracic aortic ectasia: Secondary | ICD-10-CM | POA: Insufficient documentation

## 2020-02-23 DIAGNOSIS — R590 Localized enlarged lymph nodes: Secondary | ICD-10-CM | POA: Diagnosis not present

## 2020-02-23 DIAGNOSIS — N281 Cyst of kidney, acquired: Secondary | ICD-10-CM | POA: Diagnosis not present

## 2020-02-23 LAB — POCT I-STAT CREATININE: Creatinine, Ser: 0.8 mg/dL (ref 0.44–1.00)

## 2020-02-23 MED ORDER — GADOBUTROL 1 MMOL/ML IV SOLN
10.0000 mL | Freq: Once | INTRAVENOUS | Status: AC | PRN
  Administered 2020-02-23: 10 mL via INTRAVENOUS

## 2020-03-07 ENCOUNTER — Ambulatory Visit: Payer: Commercial Managed Care - PPO

## 2020-03-08 ENCOUNTER — Ambulatory Visit: Payer: Commercial Managed Care - PPO | Attending: Internal Medicine

## 2020-03-08 DIAGNOSIS — Z23 Encounter for immunization: Secondary | ICD-10-CM

## 2020-03-08 NOTE — Progress Notes (Signed)
   Covid-19 Vaccination Clinic  Name:  Tracey Luna    MRN: [id number] DOB: 16-Jan-1961  03/08/2020  [name] was observed post Covid-19 immunization for 15 minutes without incident. She was provided with Vaccine Information Sheet and instruction to access the V-Safe system.   [name] was instructed to call 911 with any severe reactions post vaccine: Marland Kitchen Difficulty breathing  . Swelling of face and throat  . A fast heartbeat  . A bad rash all over body  . Dizziness and weakness   Immunizations Administered    Name Date Dose VIS Date Route   Moderna COVID-19 Vaccine 03/08/2020  2:34 PM 0.5 mL 11/02/2019 Intramuscular   Manufacturer: Levan Hurst   LotFY:[id number]   [place]:[id number]

## 2020-03-20 ENCOUNTER — Telehealth: Payer: Self-pay

## 2020-05-03 NOTE — Progress Notes (Signed)
Primary Physician/Referring:  Aletha Halim., PA-C  Patient ID: Tracey Luna, female    DOB: 1961/09/26, 59 y.o.   MRN: [id number]  Chief Complaint  Patient presents with  . Aortic Root Dilation  . Hypertension  . Follow-up   HPI:   Tracey Luna  is a 59 y.o. female  Caucasian female who I had last seen in 2015, she moved from [place].  In 1982 in [place], she has had TIA with right sided weakness and speech disturbance. PMH significant for hypertension, hyperglycemia, mild hyperlipidemia, mitral valve prolapse and mild aortic root dilatation and history of tobacco use disorder with centrilobular emphysema, venous insufficiency bilateral lower extremity.  Her sister has Marphan's syndrome and had ascending aortic aneurysm repair at age 75 years.   On 12/04/2018, CT scan of the chest: 4.1 cm aortic root dilatation and aortic and coronary atherosclerosis. I had seen her 6 months ago for chest pain. She has not had any recurrence.   Past Medical History:  Diagnosis Date  . Anxiety   . Complication of anesthesia   . COPD (chronic obstructive pulmonary disease) ([place])   . GERD (gastroesophageal reflux disease)   . GERD (gastroesophageal reflux disease) 09/24/2016  . Hyperglycemia   . Hyperlipidemia   . Hypertension   . PONV (postoperative nausea and vomiting)   . Pre-diabetes   . Stroke [facility])    TIA, no deficits  . Varicose veins   . Vitamin D deficiency     Past Surgical History:  Procedure Laterality Date  . CHOLECYSTECTOMY    . EXCISION ORAL TUMOR Right 05/20/2017   Procedure: EXCISION RIGHT BUCCAL MUCOSAL LESION;  Surgeon: Leta Baptist, MD;  Location: [place];  Service: ENT;  Laterality: Right;   Family History  Problem Relation Age of Onset  . Heart disease Mother   . Varicose Veins Mother   . Bleeding Disorder Mother   . Heart disease Father        before age 46  . Hyperlipidemia Father   . Hypertension Father   . Heart attack Father   .  Heart disease Sister   . Hypertension Sister   . Diabetes Brother   . Hypertension Brother    Social History   Tobacco Use  . Smoking status: Current Every Day Smoker    Packs/day: 0.50    Years: 30.00    Pack years: 15.00    Types: Cigarettes  . Smokeless tobacco: Never Used  . Tobacco comment: 1/2 pack per day   Substance Use Topics  . Alcohol use: Yes    Alcohol/week: 0.0 standard drinks    Comment: occ   Marital Status: Married  Review of systems   Review of Systems  Cardiovascular: Positive for dyspnea on exertion. Negative for leg swelling and syncope.  Gastrointestinal: Negative for melena.    Objective  Blood pressure 128/87, pulse 73, height _0  (1.626 m), weight 199 lb (90.3 kg), SpO2 95 %. Body mass index is 34.16 kg/m.  Vitals with BMI 05/04/2020 06/10/2019 01/05/2019  Height _1  _2  _3   Weight 199 lbs 237 lbs 198 lbs 13 oz  BMI 34.14 99.24 26.83  Systolic 419 622 297  Diastolic 87 88 82  Pulse 73 84 65       Physical Exam  Constitutional: Vital signs are normal. She appears well-developed. No distress.  Mildly obese  Cardiovascular: Normal rate, regular rhythm and intact distal pulses. Exam reveals no gallop.  Murmur heard.  Midsystolic murmur is present with a grade of 2/6 at the upper right sternal border and apex. No leg edema, no JVD.   Pulmonary/Chest: Effort normal and breath sounds normal. No accessory muscle usage. No respiratory distress.  Abdominal: Soft. Bowel sounds are normal.  Vitals reviewed.   Laboratory examination:   Recent Labs    02/23/20 1529  CREATININE 0.80   CrCl cannot be calculated (Patient's most recent lab result is older than the maximum 21 days allowed.).  CMP Latest Ref Rng & Units 02/23/2020 05/20/2017 07/21/2009  Glucose 65 - 99 mg/dL - 148(H) 103(H)  BUN 6 - 20 mg/dL - 17 11  Creatinine 0.44 - 1.00 mg/dL 0.80 0.70 0.75  Sodium 135 - 145 mmol/L - 139 139  Potassium 3.5 - 5.1 mmol/L - 3.6 3.8  Chloride  101 - 111 mmol/L - 101 103  CO2 19 - 32 mEq/L - - 28  Calcium 8.4 - 10.5 mg/dL - - 10.1   CBC Latest Ref Rng & Units 05/20/2017 07/21/2009  WBC 4.0 - 10.5 K/uL - 8.4  Hemoglobin 12.0 - 15.0 g/dL 13.9 14.5  Hematocrit 36.0 - 46.0 % 41.0 41.5  Platelets 150 - 400 K/uL - 232   Lipid Panel  No results found for: CHOL, TRIG, HDL, CHOLHDL, VLDL, LDLCALC, LDLDIRECT HEMOGLOBIN A1C No results found for: HGBA1C, MPG TSH No results for input(s): TSH in the last 8760 hours.  External Labs: 11/11/2018: cholesterol 179, triglycerides132, HDL 47, LDL 106. HgbA1c 6.6%. Na 145, eGFR 90, potassium 3.9, CMP otherwise normal. TSH normal.    Medication and allergies    Current Outpatient Medications  Medication Instructions  . atorvastatin (LIPITOR) 40 mg, Oral, Daily  . buPROPion (WELLBUTRIN SR) 150 mg, Oral, 2 times daily at 10am and 4pm, Start 1 tab daily for 3 days  . Fluticasone-Umeclidin-Vilant (TRELEGY ELLIPTA) 100-62.5-25 MCG/INH AEPB 1 puff, Inhalation, Daily  . hydrochlorothiazide (HYDRODIURIL) 25 mg, Oral, Daily  . LORazepam (ATIVAN) 0.5 mg, Oral, As needed  . losartan (COZAAR) 100 MG tablet TAKE 1 TABLET(100 MG) BY MOUTH DAILY  . metFORMIN (GLUCOPHAGE-XR) 500 MG 24 hr tablet Take one pill with your largest meal  . nicotine (NICODERM CQ) 21 mg, Transdermal, Daily  . nicotine (NICODERM CQ) 14 mg, Transdermal, Daily, Start after 21 mg Rx done  . NON FORMULARY [facility] Anti-Inflammatory Cream-Diclofenac 3%, Baclofen 2%, Lidocaine 2%Apply 1-2 grams to affected area 3-4 times dailyQty. 120 gm3 refills   . nystatin cream (MYCOSTATIN) nystatin 100,000 unit/gram topical cream  APP EXT AA BID  . omeprazole (PRILOSEC) 40 mg, Oral, Daily    Radiology:   CT of chest 12/04/2018:  No acute disease. The ascending thoracic aorta measures 4.1 cm in diameter. Recommend annual imaging followup by CTA or MRA.  Aortic Atherosclerosis and Emphysema  MR Angio Chest 02/23/2020: Aorta: Aortic root  measures up to 3.8 cm. Sinotubular junction measures roughly 3.3 cm. Mid ascending thoracic aorta measures 4.1 cm and stable from the prior examination.  Typical three-vessel arch anatomy. Proximal great vessels are patent. Proximal aortic arch measures 3.5 cm. Distal aortic arch measures roughly 2.7 cm. Proximal descending thoracic aorta measures 2.7 cm and stable. Mid descending thoracic aorta measures 2.6 cm. Distal descending thoracic aorta measures 2.5 cm. Negative for an aortic dissection.  Heart: Heart size is normal.  No significant pericardial fluid.   Cardiac Studies:   Echocardiogram 06/16/2019: Normal LV systolic function with EF 56%. Left ventricle cavity is normal in size. Mild  concentric hypertrophy of the left ventricle. Normal global wall motion. Doppler evidence of grade I (impaired) diastolic dysfunction, normal LAP.  Mild (Grade I) mitral regurgitation. No significant mitral valve prolapse noted. Mild tricuspid regurgitation. Estimated pulmonary artery systolic pressure is 23 mmHg. No significant change noted compared to previous study in 2013.   Lexiscan Sestamibi Stress Test 06/28/2019: Stress EKG is non-diagnostic, as this is pharmacological stress test. Myocardial perfusion imaging is normal. Left ventricular ejection fraction is  73% with normal wall motion. Low risk study.  EKG   EKG 05/04/2020: Normal sinus rhythm at rate of 72 bpm, normal axis.  No evidence of ischemia, normal EKG.    06/10/2019: Normal sinus rhythm at 76 bpm, normal axis, PRWP, cannot exclude anterior infarct old. IRBBB. No evidence of ischemia.   Assessment     ICD-10-CM   1. Coronary atherosclerosis due to calcified coronary lesion  I25.10 EKG 12-Lead   I25.84 nicotine (NICODERM CQ) 21 mg/24hr patch    nicotine (NICODERM CQ) 14 mg/24hr patch  2. Essential hypertension  I10   3. Aortic root dilatation (HCC)  I77.810   4. Tobacco use  Z72.0 nicotine (NICODERM CQ) 21 mg/24hr patch     nicotine (NICODERM CQ) 14 mg/24hr patch    buPROPion (WELLBUTRIN SR) 150 MG 12 hr tablet    Recommendations:   Tracey Luna  is a 59 y.o. female  Caucasian female who I had last seen in 2015, she moved from [place].  In 1982 in [place], she has had TIA with right sided weakness and speech disturbance. PMH significant for hypertension, hyperglycemia, mild hyperlipidemia, mitral valve prolapse and mild aortic root dilatation and history of tobacco use disorder with centrilobular emphysema, venous insufficiency bilateral lower extremity.  Her sister has Marphan's syndrome and had asc aortic aneurysm repair at age 45 years. Patient with hypertension, hyeprlipidemia, hyperglycemia, COPD, type 2 diabetes controlled by diet, ongoing tobacco use, mitral valve prolapse, recently evaluated by [place] for coronary calcification and aortic root dilation on recent CT scan of the chest.  She has not had any recurrence of chest pain, physical examination is unremarkable except for mild to moderate obesity.  I reviewed recently performed MR angiogram of the chest, aortic root dilatation has remained stable.  Her sister was recently diagnosed with aortic dissection however she has Marfan syndrome, patient herself does not have any marfanoid features.  I suspect aortic root dilatation is related to tobacco use disorder specifically and hypertension.  I again discussed with her regarding aggressive management of lipids, smoking cessation, counseling additional 6 minutes regarding tobacco use and convinced her to use nicotine patches and also Wellbutrin.  Weight loss was discussed.  Otherwise stable from cardiac standpoint, I do not think she needs annual screening for aortic aneurysm, we could certainly consider doing this every other year.  I would like to see her back in a year and I will make decision at that point.  Blood pressure is well controlled, her labs reviewed and lipids are also good control.   Adrian Prows,  MD, [facility] 05/04/2020, 3:29 PM [facility] [place] Pager: [phone] Office: [phone]

## 2020-05-04 ENCOUNTER — Other Ambulatory Visit: Payer: Self-pay

## 2020-05-04 ENCOUNTER — Ambulatory Visit: Payer: Commercial Managed Care - PPO | Admitting: Cardiology

## 2020-05-04 ENCOUNTER — Encounter: Payer: Self-pay | Admitting: Cardiology

## 2020-05-04 VITALS — BP 128/87 | HR 73 | Ht 64.0 in | Wt 199.0 lb

## 2020-05-04 DIAGNOSIS — Z720 Tobacco use: Secondary | ICD-10-CM

## 2020-05-04 DIAGNOSIS — I10 Essential (primary) hypertension: Secondary | ICD-10-CM

## 2020-05-04 DIAGNOSIS — I2510 Atherosclerotic heart disease of native coronary artery without angina pectoris: Secondary | ICD-10-CM

## 2020-05-04 DIAGNOSIS — I77810 Thoracic aortic ectasia: Secondary | ICD-10-CM

## 2020-05-04 MED ORDER — NICOTINE 14 MG/24HR TD PT24: 14.0000 mg | MEDICATED_PATCH | Freq: Every day | TRANSDERMAL | 2 refills | Status: DC

## 2020-05-04 MED ORDER — BUPROPION HCL ER (SR) 150 MG PO TB12: 150.0000 mg | ORAL_TABLET | Freq: Two times a day (BID) | ORAL | 2 refills | Status: DC

## 2020-05-04 MED ORDER — NICOTINE 21 MG/24HR TD PT24: 21.0000 mg | MEDICATED_PATCH | Freq: Every day | TRANSDERMAL | 0 refills | Status: DC

## 2020-05-19 ENCOUNTER — Other Ambulatory Visit: Payer: Self-pay

## 2020-05-19 MED ORDER — ATORVASTATIN CALCIUM 40 MG PO TABS: 40.0000 mg | ORAL_TABLET | Freq: Every day | ORAL | 3 refills | Status: DC

## 2020-09-01 ENCOUNTER — Other Ambulatory Visit: Payer: Self-pay | Admitting: Cardiology

## 2020-09-03 ENCOUNTER — Other Ambulatory Visit: Payer: Self-pay | Admitting: Cardiology

## 2020-11-02 ENCOUNTER — Other Ambulatory Visit (HOSPITAL_COMMUNITY): Payer: Self-pay | Admitting: Family Medicine

## 2020-11-02 DIAGNOSIS — Z1231 Encounter for screening mammogram for malignant neoplasm of breast: Secondary | ICD-10-CM

## 2020-11-13 ENCOUNTER — Inpatient Hospital Stay (HOSPITAL_COMMUNITY): Admission: RE | Admit: 2020-11-13 | Payer: Commercial Managed Care - PPO | Source: Ambulatory Visit

## 2020-11-16 ENCOUNTER — Ambulatory Visit (HOSPITAL_COMMUNITY)
Admission: RE | Admit: 2020-11-16 | Discharge: 2020-11-16 | Disposition: A | Discharge status: Home / Self Care | Payer: Commercial Managed Care - PPO | Source: Ambulatory Visit | Attending: Family Medicine | Admitting: Family Medicine

## 2020-11-16 ENCOUNTER — Other Ambulatory Visit: Payer: Self-pay

## 2020-11-16 DIAGNOSIS — Z1231 Encounter for screening mammogram for malignant neoplasm of breast: Secondary | ICD-10-CM | POA: Diagnosis not present

## 2020-12-15 ENCOUNTER — Telehealth: Payer: Self-pay | Admitting: Physician Assistant

## 2020-12-15 NOTE — Telephone Encounter (Signed)
Called pt 1X left vm for pt to call reschedule with PA Clark due to weather

## 2020-12-18 ENCOUNTER — Ambulatory Visit: Payer: Commercial Managed Care - PPO | Admitting: Physician Assistant

## 2020-12-26 ENCOUNTER — Ambulatory Visit: Payer: Commercial Managed Care - PPO | Admitting: Orthopaedic Surgery

## 2020-12-28 ENCOUNTER — Ambulatory Visit: Payer: Self-pay

## 2020-12-28 ENCOUNTER — Ambulatory Visit (INDEPENDENT_AMBULATORY_CARE_PROVIDER_SITE_OTHER): Payer: Commercial Managed Care - PPO | Admitting: Orthopaedic Surgery

## 2020-12-28 VITALS — Ht 64.0 in | Wt 196.0 lb

## 2020-12-28 DIAGNOSIS — M25562 Pain in left knee: Secondary | ICD-10-CM

## 2020-12-28 DIAGNOSIS — S83242D Other tear of medial meniscus, current injury, left knee, subsequent encounter: Secondary | ICD-10-CM | POA: Insufficient documentation

## 2020-12-28 NOTE — Progress Notes (Signed)
Office Visit Note   Patient: Tracey Luna           Date of Birth: 06-24-1961           MRN: [id number] Visit Date: 12/28/2020              Requested by: Aletha Halim., PA-C [address]  [place] [address] PCP: Aletha Halim., PA-C   Assessment & Plan: Visit Diagnoses:  1. Left knee pain, unspecified chronicity   2. Acute medial meniscal tear, left, subsequent encounter     Plan: She does have an acute meniscal tear of her left knee but also her knee is worrisome for cartilage wearing down.  I showed her knee model went over the MRI and explained in detail surgical recommendations.  I do feel it is worth at least trying a knee arthroscopy and then later hyaluronic acid for the knee with quad strengthening exercise and weight loss and this could buy her some time.  I would not recommend knee replacement right now due to the fact that she has not had good blood glucose control either.  I explained in detail what knee arthroscopy involves including the risk and benefits of this type of surgery.  I talked about her interoperative and postoperative course and what to expect.  She said that she would like to think about this.  I gave her my surgery scheduler's card and we can set this up for an outpatient when she like [place] to proceed.  She states she will let us know.  All questions and concerns were answered and addressed.  Follow-Up Instructions: Return for 1 week post-op.   Orders:  No orders of the defined types were placed in this encounter.  No orders of the defined types were placed in this encounter.     Procedures: No procedures performed   Clinical Data: No additional findings.   Subjective: Chief Complaint  Patient presents with  . Left Knee - Pain  The patient is a 60 year old female who comes in for second opinion as a relates to her left knee.  She has never had issues with her knee until she sustained a twisting injury last October without  left knee.  She developed pain and swelling and saw one of my colleagues in town with another group.  She had at least 2 steroid injections in that knee and when that did not help a MRI was obtained in December of this past year.  The MRI of the left knee does show a large horizontal tear of the medial meniscus of her knee.  There is concerned about some areas of full thickness cartilage loss in the medial compartment of the knee.  She had not had any knee issues before then.  She is a diabetic and does not report good diabetic control either.  She has been having some locking and catching in her left knee as well as pain with weightbearing.  It does throb quite a bit at night she states.  She did bring her MRI for me to review today.  HPI  Review of Systems She currently denies any headache, chest pain, shortness of breath, fever, chills, nausea, vomiting  Objective: Vital Signs: Ht 5\' 4"  (1.626 m)   Wt 196 lb (88.9 kg)   BMI 33.64 kg/m   Physical Exam She is alert and orient x3 and in no acute distress Ortho Exam Examination of her left knee shows mild effusion.  There is  significant pain over the medial joint line and the pes bursa area of the left knee.  She has good range of motion of the knee with medial joint line tenderness and a positive McMurray sign to the medial compartment of the knee. Specialty Comments:  No specialty comments available.  Imaging: No results found. The MRI of the left knee does show a large horizontal meniscal tear with a flap component.  There is subchondral edema in the medial femoral condyle and the medial tibial plateau with some areas of significant cartilage thinning on that part of her knee.  The remainder of her knee is normal.  PMFS History: Patient Active Problem List   Diagnosis Date Noted  . Acute medial meniscal tear, left, subsequent encounter 12/28/2020  . Mixed restrictive and obstructive lung disease ([place]) 01/05/2019  . Plantar fasciitis  11/28/2016  . Osteoarthritis of left ankle and foot 11/28/2016  . Hav (hallux abducto valgus), right 11/28/2016  . High cholesterol 09/24/2016  . High blood pressure 09/24/2016  . GERD (gastroesophageal reflux disease) 09/24/2016   Past Medical History:  Diagnosis Date  . Anxiety   . Complication of anesthesia   . COPD (chronic obstructive pulmonary disease) ([place])   . GERD (gastroesophageal reflux disease)   . GERD (gastroesophageal reflux disease) 09/24/2016  . Hyperglycemia   . Hyperlipidemia   . Hypertension   . PONV (postoperative nausea and vomiting)   . Pre-diabetes   . Stroke [facility])    TIA, no deficits  . Varicose veins   . Vitamin D deficiency     Family History  Problem Relation Age of Onset  . Heart disease Mother   . Varicose Veins Mother   . Bleeding Disorder Mother   . Heart disease Father        before age 8  . Hyperlipidemia Father   . Hypertension Father   . Heart attack Father   . Heart disease Sister   . Hypertension Sister   . Diabetes Brother   . Hypertension Brother     Past Surgical History:  Procedure Laterality Date  . CHOLECYSTECTOMY    . EXCISION ORAL TUMOR Right 05/20/2017   Procedure: EXCISION RIGHT BUCCAL MUCOSAL LESION;  Surgeon: Leta Baptist, MD;  Location: [facility];  Service: ENT;  Laterality: Right;   Social History   Occupational History  . Not on file  Tobacco Use  . Smoking status: Current Every Day Smoker    Packs/day: 0.50    Years: 30.00    Pack years: 15.00    Types: Cigarettes  . Smokeless tobacco: Never Used  . Tobacco comment: 1/2 pack per day   Vaping Use  . Vaping Use: Never used  Substance and Sexual Activity  . Alcohol use: Yes    Alcohol/week: 0.0 standard drinks    Comment: occ  . Drug use: No  . Sexual activity: Not on file

## 2021-01-07 ENCOUNTER — Encounter: Payer: Self-pay | Admitting: Orthopaedic Surgery

## 2021-01-21 ENCOUNTER — Encounter: Payer: Self-pay | Admitting: Cardiology

## 2021-01-22 NOTE — Progress Notes (Signed)
I have left her a message.  Waiting for a return call.  Thanks!

## 2021-01-31 ENCOUNTER — Telehealth: Payer: Self-pay

## 2021-01-31 NOTE — Telephone Encounter (Signed)
Can you prescribe anything for pain until she can have surgery?  Planning surgery for May.  Uses [facility], [address] [place].

## 2021-02-01 MED ORDER — TRAMADOL HCL 50 MG PO TABS: 50.0000 mg | ORAL_TABLET | Freq: Four times a day (QID) | ORAL | 0 refills | Status: DC | PRN

## 2021-05-02 ENCOUNTER — Telehealth: Payer: Self-pay

## 2021-05-02 NOTE — Telephone Encounter (Signed)
Notes on file.

## 2021-05-09 ENCOUNTER — Ambulatory Visit: Payer: Commercial Managed Care - PPO | Admitting: Cardiology

## 2021-07-01 NOTE — Progress Notes (Signed)
Cardiology Office Note:    Date:  07/05/2021   ID:  CATRENA THURN, DOB 1961/05/24, MRN [id number]  PCP:  Aletha Halim., PA-C  Cardiologist:  None  Electrophysiologist:  None   Referring MD: Aletha Halim., PA-C   Chief Complaint  Patient presents with   Palpitations     History of Present Illness:    Tracey Luna is a 60 y.o. female with a hx of TIA, hypertension, hyperlipidemia, mitral valve prolapse, aortic dilatation, tobacco use, COPD who is referred by Bing Matter, PA for evaluation of aortic dilatation.  She previously followed with Dr. Einar Gip in cardiology, last seen 05/04/20.  Echocardiogram on 06/15/2020 showed normal LV systolic function, mild LVH, grade 1 diastolic dysfunction, mild MR.  Lexiscan Myoview on 06/28/2019 showed normal perfusion.  CT chest without contrast 12/04/2018 showed ascending aortic dilatation measuring 41 mm.  MRA chest 02/23/2020 showed stable ascending aortic dilatation measuring 41 mm.  She reports intermittent chest pain that she describes as sharp pain that lasts for few seconds and resolves.  Reports occasional shortness of breath.  She had COVID-19 infection in January 2022.  Had COPD exacerbation after this.  Reports occasional lightheadedness with standing, denies any syncope.  Reports intermittent lower extremity edema.  Reports having palpitations occur about once per week, can last for hours.  Smokes 0.5 to 0.75 ppd, has smoked for over 40 years.  Family history includes both her sister and mother have 80 and her sister had her thoracic aortic aneurysm repaired.  Her father has had multiple MIs, first in his 83s, and had a pacemaker.    Past Medical History:  Diagnosis Date   Anxiety    Complication of anesthesia    COPD (chronic obstructive pulmonary disease) (H[facility])    GERD (gastroesophageal reflux disease)    GERD (gastroesophageal reflux disease) 09/24/2016   Hyperglycemia    Hyperlipidemia    Hypertension    PONV  (postoperative nausea and vomiting)    Pre-diabetes    Stroke (H[facility])    TIA, no deficits   Varicose veins    Vitamin D deficiency     Past Surgical History:  Procedure Laterality Date   CHOLECYSTECTOMY     EXCISION ORAL TUMOR Right 05/20/2017   Procedure: EXCISION RIGHT BUCCAL MUCOSAL LESION;  Surgeon: Leta Baptist, MD;  Location: [facility];  Service: ENT;  Laterality: Right;    Current Medications: Current Meds  Medication Sig   atorvastatin (LIPITOR) 40 MG tablet Take 1 tablet (40 mg total) by mouth daily.   Fluticasone-Umeclidin-Vilant (TRELEGY ELLIPTA) 100-62.5-25 MCG/INH AEPB Inhale 1 puff into the lungs daily.   hydrochlorothiazide (HYDRODIURIL) 25 MG tablet TAKE 1 TABLET(25 MG) BY MOUTH DAILY   losartan (COZAAR) 100 MG tablet TAKE 1 TABLET(100 MG) BY MOUTH DAILY   metFORMIN (GLUCOPHAGE-XR) 500 MG 24 hr tablet Take one pill with your largest meal   NON FORMULARY [facility]  Anti-Inflammatory Cream- Diclofenac 3%, Baclofen 2%, Lidocaine 2% Apply 1-2 grams to affected area 3-4 times daily Qty. 120 gm 3 refills   nystatin cream (MYCOSTATIN) nystatin 100,000 unit/gram topical cream  APP EXT AA BID   omeprazole (PRILOSEC) 40 MG capsule Take 40 mg by mouth daily.     Allergies:   Avelox [moxifloxacin hcl in nacl], Penicillins, and Vancomycin   Social History   Socioeconomic History   Marital status: Married    Spouse name: Not on file   Number of children: 3   Years of education:  Not on file   Highest education level: Not on file  Occupational History   Not on file  Tobacco Use   Smoking status: Every Day    Packs/day: 0.50    Years: 30.00    Pack years: 15.00    Types: Cigarettes   Smokeless tobacco: Never   Tobacco comments:    1/2 pack per day   Vaping Use   Vaping Use: Never used  Substance and Sexual Activity   Alcohol use: Yes    Alcohol/week: 0.0 standard drinks    Comment: occ   Drug use: No   Sexual activity: Not on file  Other  Topics Concern   Not on file  Social History Narrative   Not on file   Social Determinants of Health   Financial Resource Strain: Not on file  Food Insecurity: Not on file  Transportation Needs: Not on file  Physical Activity: Not on file  Stress: Not on file  Social Connections: Not on file     Family History: The patient's family history includes Bleeding Disorder in her mother; Diabetes in her brother; Heart attack in her father; Heart disease in her father, mother, and sister; Hyperlipidemia in her father; Hypertension in her brother, father, and sister; Varicose Veins in her mother.  ROS:   Please see the history of present illness.     All other systems reviewed and are negative.  EKGs/Labs/Other Studies Reviewed:    The following studies were reviewed today:   EKG:  EKG is ordered today.  The ekg ordered today demonstrates normal sinus rhythm, rate 83, no ST/T abnormality  Recent Labs: No results found for requested labs within last 8760 hours.  Recent Lipid Panel No results found for: CHOL, TRIG, HDL, CHOLHDL, VLDL, LDLCALC, LDLDIRECT  Physical Exam:    VS:  BP 128/82 (BP Location: Left Arm, Patient Position: Sitting, Cuff Size: Large)   Pulse 83   Ht 5' 4.5" (1.638 m)   Wt 196 lb 12.8 oz (89.3 kg)   SpO2 98%   BMI 33.26 kg/m     Wt Readings from Last 3 Encounters:  07/05/21 196 lb 12.8 oz (89.3 kg)  12/28/20 196 lb (88.9 kg)  05/04/20 199 lb (90.3 kg)     GEN:  Well nourished, well developed in no acute distress HEENT: Normal NECK: No JVD; No carotid bruits LYMPHATICS: No lymphadenopathy CARDIAC: RRR, no murmurs, rubs, gallops RESPIRATORY:  Clear to auscultation without rales, wheezing or rhonchi  ABDOMEN: Soft, non-tender, non-distended MUSCULOSKELETAL:  No edema; No deformity  SKIN: Warm and dry NEUROLOGIC:  Alert and oriented x 3 PSYCHIATRIC:  Normal affect   ASSESSMENT:    1. Aortic dilatation (HCC)   2. Snoring   3. Daytime somnolence    4. Palpitations   5. Essential hypertension   6. Tobacco use   7. Hyperlipidemia, unspecified hyperlipidemia type    PLAN:     Aortic dilatation: Measured 41 mm on MRA chest 02/23/2020.  She reports she is not able to tolerate MRIs due to claustrophobia.  Will follow-up with CTA chest.  Hypertension: On losartan 100 mg daily, hydrochlorothiazide 25 mg daily.  Appears controlled  Hyperlipidemia: On atorvastatin 40 mg daily.  LDL 96 on 05/09/2020  Tobacco use: Patient counseled on the risk of tobacco use and cessation strongly encouraged  Palpitations: Description concerning for arrhythmia, will evaluate with Zio patch x2 weeks  Daytime somnolence/snoring: Will check sleep study  T2DM: On metformin.  A1c 7.6 on 02/06/2021  RTC in 6 months    Medication Adjustments/Labs and Tests Ordered: Current medicines are reviewed at length with the patient today.  Concerns regarding medicines are outlined above.  Orders Placed This Encounter  Procedures   CT ANGIO CHEST AORTA W/ & OR WO/CM & GATING ([place] ONLY)   Basic metabolic panel   LONG TERM MONITOR (3-14 DAYS)   EKG 12-Lead   Home sleep test    No orders of the defined types were placed in this encounter.   Patient Instructions  Medication Instructions:  No Changes In Medications at this time.  *If you need a refill on your cardiac medications before your next appointment, please call your pharmacy*  Lab Work: BMET- TODAY  If you have labs (blood work) drawn today and your tests are completely normal, you will receive your results only by: [place] (if you have MyChart) OR A paper copy in the mail If you have any lab test that is abnormal or we need to change your treatment, we will call you to review the results.  Testing/Procedures: CTA CHEST- SOMEONE WILL REACH OUT YOU TO GET THIS SCHEDULED   ZIO XT- Long Term Monitor Instructions   Your physician has requested you wear your ZIO patch monitor 14 days.    This is a single patch monitor.  [employer] supplies one patch monitor per enrollment.  Additional stickers are not available.   Please do not apply patch if you will be having a Nuclear Stress Test, Echocardiogram, Cardiac CT, MRI, or Chest Xray during the time frame you would be wearing the monitor. The patch cannot be worn during these tests.  You cannot remove and re-apply the ZIO XT patch monitor.   Your ZIO patch monitor will be sent USPS Priority mail from [employer] directly to your home address. The monitor may also be mailed to a PO BOX if home delivery is not available.   It may take 3-5 days to receive your monitor after you have been enrolled.   Once you have received you monitor, please review enclosed instructions.  Your monitor has already been registered assigning a specific monitor serial # to you.   Applying the monitor   Shave hair from upper left chest.   Hold abrader disc by orange tab.  Rub abrader in 40 strokes over left upper chest as indicated in your monitor instructions.   Clean area with 4 enclosed alcohol pads .  Use all pads to assure are is cleaned thoroughly.  Let dry.   Apply patch as indicated in monitor instructions.  Patch will be place under collarbone on left side of chest with arrow pointing upward.   Rub patch adhesive wings for 2 minutes.Remove white label marked "1".  Remove white label marked "2".  Rub patch adhesive wings for 2 additional minutes.   While looking in a mirror, press and release button in center of patch.  A small green light will flash 3-4 times .  This will be your only indicator the monitor has been turned on.     Do not shower for the first 24 hours.  You may shower after the first 24 hours.   Press button if you feel a symptom. You will hear a small click.  Record Date, Time and Symptom in the Patient Log Book.   When you are ready to remove patch, follow instructions on last 2 pages of Patient Log Book.  Stick  patch monitor onto last page of Patient Log  Book.   Place Patient Log Book in [place] box.  Use locking tab on box and tape box closed securely.  The [employer] and [employer] has [employer] on it.  Please place in mailbox as soon as possible.  Your physician should have your test results approximately 7 days after the monitor has been mailed back to [facility]   Call [place] at [phone] if you have questions regarding your ZIO XT patch monitor.  Call them immediately if you see an orange light blinking on your monitor.   If your monitor falls off in less than 4 days contact our Monitor department at [phone].  If your monitor becomes loose or falls off after 4 days call [employer] at [phone] for suggestions on securing your monitor.    Your physician has recommended that you have a sleep study. This test records several body functions during sleep, including: brain activity, eye movement, oxygen and carbon dioxide blood levels, heart rate and rhythm, breathing rate and rhythm, the flow of air through your mouth and nose, snoring, body muscle movements, and chest and belly movement. SOMEONE WILL REACH OUT TO YOU TO GET THIS SCHEDULED   Follow-Up: At [facility], you and your health needs are our priority.  As part of our continuing mission to provide you with exceptional heart care, we have created designated Provider Care Teams.  These Care Teams include your primary Cardiologist (physician) and Advanced Practice Providers (APPs -  Physician Assistants and Nurse Practitioners) who all work together to provide you with the care you need, when you need it.  Your next appointment:   6 month(s)  The format for your next appointment:   In Person  Provider:   Oswaldo Milian, MD  Other Instructions THE CARE GUIDE WILL REACH OUT TO YOU REGARDING SMOKING CESSATION    Signed, Donato Heinz, MD  07/05/2021 6:14 PM    [facility]

## 2021-07-05 ENCOUNTER — Ambulatory Visit (INDEPENDENT_AMBULATORY_CARE_PROVIDER_SITE_OTHER): Payer: Commercial Managed Care - PPO | Admitting: Cardiology

## 2021-07-05 ENCOUNTER — Other Ambulatory Visit: Payer: Self-pay

## 2021-07-05 ENCOUNTER — Encounter: Payer: Self-pay | Admitting: Cardiology

## 2021-07-05 ENCOUNTER — Ambulatory Visit (INDEPENDENT_AMBULATORY_CARE_PROVIDER_SITE_OTHER): Payer: Commercial Managed Care - PPO

## 2021-07-05 VITALS — BP 128/82 | HR 83 | Ht 64.5 in | Wt 196.8 lb

## 2021-07-05 DIAGNOSIS — R002 Palpitations: Secondary | ICD-10-CM

## 2021-07-05 DIAGNOSIS — R400 Somnolence: Secondary | ICD-10-CM | POA: Diagnosis not present

## 2021-07-05 DIAGNOSIS — Z720 Tobacco use: Secondary | ICD-10-CM

## 2021-07-05 DIAGNOSIS — R0683 Snoring: Secondary | ICD-10-CM | POA: Diagnosis not present

## 2021-07-05 DIAGNOSIS — I712 Thoracic aortic aneurysm, without rupture, unspecified: Secondary | ICD-10-CM

## 2021-07-05 DIAGNOSIS — I77819 Aortic ectasia, unspecified site: Secondary | ICD-10-CM

## 2021-07-05 DIAGNOSIS — I10 Essential (primary) hypertension: Secondary | ICD-10-CM

## 2021-07-05 DIAGNOSIS — E785 Hyperlipidemia, unspecified: Secondary | ICD-10-CM

## 2021-07-05 NOTE — Patient Instructions (Signed)
Medication Instructions:  No Changes In Medications at this time.  *If you need a refill on your cardiac medications before your next appointment, please call your pharmacy*  Lab Work: BMET- TODAY  If you have labs (blood work) drawn today and your tests are completely normal, you will receive your results only by: [place] (if you have MyChart) OR A paper copy in the mail If you have any lab test that is abnormal or we need to change your treatment, we will call you to review the results.  Testing/Procedures: CTA CHEST- SOMEONE WILL REACH OUT YOU TO GET THIS SCHEDULED   ZIO XT- Long Term Monitor Instructions   Your physician has requested you wear your ZIO patch monitor 14 days.   This is a single patch monitor.  [employer] supplies one patch monitor per enrollment.  Additional stickers are not available.   Please do not apply patch if you will be having a Nuclear Stress Test, Echocardiogram, Cardiac CT, MRI, or Chest Xray during the time frame you would be wearing the monitor. The patch cannot be worn during these tests.  You cannot remove and re-apply the ZIO XT patch monitor.   Your ZIO patch monitor will be sent USPS Priority mail from [facility] directly to your home address. The monitor may also be mailed to a PO BOX if home delivery is not available.   It may take 3-5 days to receive your monitor after you have been enrolled.   Once you have received you monitor, please review enclosed instructions.  Your monitor has already been registered assigning a specific monitor serial # to you.   Applying the monitor   Shave hair from upper left chest.   Hold abrader disc by orange tab.  Rub abrader in 40 strokes over left upper chest as indicated in your monitor instructions.   Clean area with 4 enclosed alcohol pads .  Use all pads to assure are is cleaned thoroughly.  Let dry.   Apply patch as indicated in monitor instructions.  Patch will be place under collarbone  on left side of chest with arrow pointing upward.   Rub patch adhesive wings for 2 minutes.Remove white label marked "1".  Remove white label marked "2".  Rub patch adhesive wings for 2 additional minutes.   While looking in a mirror, press and release button in center of patch.  A small green light will flash 3-4 times .  This will be your only indicator the monitor has been turned on.     Do not shower for the first 24 hours.  You may shower after the first 24 hours.   Press button if you feel a symptom. You will hear a small click.  Record Date, Time and Symptom in the Patient Log Book.   When you are ready to remove patch, follow instructions on last 2 pages of Patient Log Book.  Stick patch monitor onto last page of Patient Log Book.   Place Patient Log Book in [place] box.  Use locking tab on box and tape box closed securely.  The [employer] has [employer] on it.  Please place in mailbox as soon as possible.  Your physician should have your test results approximately 7 days after the monitor has been mailed back to [facility].   Call [place] at [phone] if you have questions regarding your ZIO XT patch monitor.  Call them immediately if you see an orange light blinking on  your monitor.   If your monitor falls off in less than 4 days contact our Monitor department at [phone].  If your monitor becomes loose or falls off after 4 days call [employer] at [phone] for suggestions on securing your monitor.    Your physician has recommended that you have a sleep study. This test records several body functions during sleep, including: brain activity, eye movement, oxygen and carbon dioxide blood levels, heart rate and rhythm, breathing rate and rhythm, the flow of air through your mouth and nose, snoring, body muscle movements, and chest and belly movement. SOMEONE WILL REACH OUT TO YOU TO GET THIS SCHEDULED   Follow-Up: At [facility], you and  your health needs are our priority.  As part of our continuing mission to provide you with exceptional heart care, we have created designated Provider Care Teams.  These Care Teams include your primary Cardiologist (physician) and Advanced Practice Providers (APPs -  Physician Assistants and Nurse Practitioners) who all work together to provide you with the care you need, when you need it.  Your next appointment:   6 month(s)  The format for your next appointment:   In Person  Provider:   Oswaldo Milian, MD  Other Instructions [facility] [place]

## 2021-07-05 NOTE — Addendum Note (Signed)
Addended by: Rexanne Mano B on: 07/05/2021 06:24 PM   Modules accepted: Orders

## 2021-07-05 NOTE — Progress Notes (Unsigned)
Patient enrolled for [employer] to mail a 14 day ZIO XT monitor to her address on file.

## 2021-07-06 ENCOUNTER — Telehealth: Payer: Self-pay | Admitting: *Deleted

## 2021-07-06 NOTE — Telephone Encounter (Signed)
Left sleep study appointment details on cell voicemail.

## 2021-07-14 DIAGNOSIS — R002 Palpitations: Secondary | ICD-10-CM | POA: Diagnosis not present

## 2021-07-25 NOTE — Telephone Encounter (Signed)
I'm not sure who schedules these, I'm assuming it's Central Scheduling for [facility]. It should be a workqueue that they work from.

## 2021-07-26 ENCOUNTER — Telehealth: Payer: Self-pay

## 2021-07-26 DIAGNOSIS — Z0000 Encounter for general adult medical examination without abnormal findings: Secondary | ICD-10-CM

## 2021-07-26 NOTE — Telephone Encounter (Signed)
Called patient to discuss health coaching for smoking cessation per Dr. Newman Nickels referral. Left patient a message to return call to Care Guide at [phone].

## 2021-08-08 ENCOUNTER — Other Ambulatory Visit: Payer: Self-pay

## 2021-08-08 ENCOUNTER — Telehealth: Payer: Self-pay | Admitting: Cardiology

## 2021-08-08 MED ORDER — METOPROLOL SUCCINATE ER 25 MG PO TB24: 25.0000 mg | ORAL_TABLET | Freq: Every day | ORAL | 1 refills | Status: DC

## 2021-08-08 NOTE — Telephone Encounter (Signed)
Called patient, gave monitor results.  Sent in RX-  Patient still has not heard about her CT ANGIO AORTA scan- was ordered per patient request at [facility], will route to primary nurse to review further.  Thanks!

## 2021-08-08 NOTE — Telephone Encounter (Signed)
Patient would like for Dr. Gardiner Rhyme or nurse to give her a call back

## 2021-08-13 ENCOUNTER — Telehealth (HOSPITAL_BASED_OUTPATIENT_CLINIC_OR_DEPARTMENT_OTHER): Payer: Self-pay | Admitting: Cardiology

## 2021-08-13 ENCOUNTER — Encounter (HOSPITAL_BASED_OUTPATIENT_CLINIC_OR_DEPARTMENT_OTHER): Payer: Commercial Managed Care - PPO | Admitting: Cardiovascular Disease

## 2021-08-13 NOTE — Telephone Encounter (Signed)
Left message for patient to call and discuss scheduling the CTA chest/aorta ordered by Dr. Allena Napoleon be scheduled at [facility]

## 2021-08-14 NOTE — Telephone Encounter (Signed)
Left message for patient to call and discuss scheduling the CTA chest/aorta ordered by Dr. Gardiner Rhyme

## 2021-08-14 NOTE — Telephone Encounter (Signed)
Patient is returning call to schedule CTA chest/aorta. I was unable to contact scheduler for transfer. Patient requested that her call be returned to her work phone at [phone].

## 2021-08-14 NOTE — Telephone Encounter (Signed)
Spoke with patient regarding the Monday 09/10/21 1:00pm CTA chest/aorta scheduled at [facility] time is 12:45 pm ---radiology department for check in---Liquids only 4  hours prior-----patient to have lab work 1 week prior to exam---she voiced her understanding and I will mail the information to her.

## 2021-09-04 LAB — BASIC METABOLIC PANEL
BUN/Creatinine Ratio: 26 (ref 12–28)
BUN: 20 mg/dL (ref 8–27)
CO2: 23 mmol/L (ref 20–29)
Calcium: 9.7 mg/dL (ref 8.7–10.3)
Chloride: 98 mmol/L (ref 96–106)
Creatinine, Ser: 0.77 mg/dL (ref 0.57–1.00)
Glucose: 145 mg/dL — ABNORMAL HIGH (ref 70–99)
Potassium: 3.8 mmol/L (ref 3.5–5.2)
Sodium: 138 mmol/L (ref 134–144)
eGFR: 87 mL/min/{1.73_m2} (ref >59)

## 2021-09-10 ENCOUNTER — Ambulatory Visit (HOSPITAL_COMMUNITY): Payer: Commercial Managed Care - PPO

## 2021-09-12 ENCOUNTER — Ambulatory Visit (HOSPITAL_COMMUNITY)
Admission: RE | Admit: 2021-09-12 | Discharge: 2021-09-12 | Disposition: A | Discharge status: Home / Self Care | Payer: Commercial Managed Care - PPO | Source: Ambulatory Visit | Attending: Cardiology | Admitting: Cardiology

## 2021-09-12 ENCOUNTER — Encounter (HOSPITAL_COMMUNITY): Payer: Self-pay | Admitting: Radiology

## 2021-09-12 ENCOUNTER — Other Ambulatory Visit: Payer: Self-pay

## 2021-09-12 DIAGNOSIS — I712 Thoracic aortic aneurysm, without rupture, unspecified: Secondary | ICD-10-CM | POA: Diagnosis not present

## 2021-09-12 MED ORDER — IOHEXOL 350 MG/ML SOLN
100.0000 mL | Freq: Once | INTRAVENOUS | Status: AC | PRN
  Administered 2021-09-12: 80 mL via INTRAVENOUS

## 2021-10-01 ENCOUNTER — Other Ambulatory Visit: Payer: Self-pay | Admitting: Cardiology

## 2021-11-07 ENCOUNTER — Telehealth: Payer: Self-pay

## 2021-11-07 NOTE — Telephone Encounter (Signed)
Letter has been sent to patient instructing them to call us if they are still interested in completing their sleep study. If we have not received a response from the patient within 30 days of this notice, the order will be cancelled and they will need to discuss the need for a sleep study at their next office visit.  ° °

## 2022-01-08 ENCOUNTER — Other Ambulatory Visit (HOSPITAL_COMMUNITY): Payer: Self-pay | Admitting: Family Medicine

## 2022-01-08 DIAGNOSIS — Z1231 Encounter for screening mammogram for malignant neoplasm of breast: Secondary | ICD-10-CM

## 2022-01-16 ENCOUNTER — Ambulatory Visit (HOSPITAL_COMMUNITY)
Admission: RE | Admit: 2022-01-16 | Discharge: 2022-01-16 | Disposition: A | Discharge status: Home / Self Care | Payer: Commercial Managed Care - PPO | Source: Ambulatory Visit | Attending: Family Medicine | Admitting: Family Medicine

## 2022-01-16 ENCOUNTER — Other Ambulatory Visit: Payer: Self-pay

## 2022-01-16 DIAGNOSIS — Z1231 Encounter for screening mammogram for malignant neoplasm of breast: Secondary | ICD-10-CM | POA: Insufficient documentation

## 2022-02-17 ENCOUNTER — Other Ambulatory Visit: Payer: Self-pay | Admitting: Cardiology

## 2022-02-25 ENCOUNTER — Other Ambulatory Visit (HOSPITAL_COMMUNITY): Payer: Self-pay | Admitting: Family Medicine

## 2022-02-25 ENCOUNTER — Other Ambulatory Visit: Payer: Self-pay | Admitting: Family Medicine

## 2022-02-25 ENCOUNTER — Other Ambulatory Visit: Payer: Self-pay | Admitting: Surgery

## 2022-02-25 DIAGNOSIS — M7989 Other specified soft tissue disorders: Secondary | ICD-10-CM

## 2022-02-28 ENCOUNTER — Other Ambulatory Visit (HOSPITAL_COMMUNITY): Payer: Self-pay | Admitting: Family Medicine

## 2022-02-28 ENCOUNTER — Ambulatory Visit (HOSPITAL_COMMUNITY)
Admission: RE | Admit: 2022-02-28 | Discharge: 2022-02-28 | Disposition: A | Discharge status: Home / Self Care | Payer: Commercial Managed Care - PPO | Source: Ambulatory Visit | Attending: Family Medicine | Admitting: Family Medicine

## 2022-02-28 DIAGNOSIS — M25562 Pain in left knee: Secondary | ICD-10-CM | POA: Insufficient documentation

## 2022-02-28 DIAGNOSIS — M7989 Other specified soft tissue disorders: Secondary | ICD-10-CM | POA: Diagnosis not present

## 2022-03-27 ENCOUNTER — Telehealth: Payer: Self-pay

## 2022-03-27 NOTE — Telephone Encounter (Addendum)
Left a detailed voice message for the patient about switching her Fri 04/05/22 9:15 AM appointment with Almyra Deforest, PA-C to Thurs 04/04/22 at 9:15 AM or another day with an APP. To call the office to get appointment rescheduled.  ?

## 2022-03-28 ENCOUNTER — Ambulatory Visit: Payer: Commercial Managed Care - PPO | Admitting: Physician Assistant

## 2022-04-04 ENCOUNTER — Encounter: Payer: Self-pay | Admitting: Physician Assistant

## 2022-04-04 ENCOUNTER — Other Ambulatory Visit: Payer: Self-pay

## 2022-04-04 ENCOUNTER — Ambulatory Visit (INDEPENDENT_AMBULATORY_CARE_PROVIDER_SITE_OTHER): Payer: Commercial Managed Care - PPO | Admitting: Physician Assistant

## 2022-04-04 VITALS — BP 116/80 | HR 80 | Ht 65.0 in | Wt 199.6 lb

## 2022-04-04 DIAGNOSIS — I7121 Aneurysm of the ascending aorta, without rupture: Secondary | ICD-10-CM

## 2022-04-04 DIAGNOSIS — Z01818 Encounter for other preprocedural examination: Secondary | ICD-10-CM

## 2022-04-04 DIAGNOSIS — I471 Supraventricular tachycardia, unspecified: Secondary | ICD-10-CM

## 2022-04-04 DIAGNOSIS — I10 Essential (primary) hypertension: Secondary | ICD-10-CM

## 2022-04-04 MED ORDER — ATORVASTATIN CALCIUM 40 MG PO TABS: 40.0000 mg | ORAL_TABLET | Freq: Every day | ORAL | 3 refills | Status: DC

## 2022-04-04 MED ORDER — LOSARTAN POTASSIUM 100 MG PO TABS: 100.0000 mg | ORAL_TABLET | Freq: Every day | ORAL | 3 refills | Status: DC

## 2022-04-04 MED ORDER — HYDROCHLOROTHIAZIDE 25 MG PO TABS: 25.0000 mg | ORAL_TABLET | Freq: Every day | ORAL | 3 refills | Status: DC

## 2022-04-04 MED ORDER — METOPROLOL SUCCINATE ER 25 MG PO TB24: 25.0000 mg | ORAL_TABLET | Freq: Every day | ORAL | 3 refills | Status: DC

## 2022-04-04 NOTE — Patient Instructions (Addendum)
Medication Instructions:  ?Your physician recommends that you continue on your current medications as directed. Please refer to the Current Medication list given to you today. ? ?*If you need a refill on your cardiac medications before your next appointment, please call your pharmacy* ? ?Lab Work: ?Your physician recommends that you return for lab work 1 week prior to CT Angio Chest Aorta :  ?BMET  ? ?If you have labs (blood work) drawn today and your tests are completely normal, you will receive your results only by: ?MyChart Message (if you have MyChart) OR ?A paper copy in the mail ?If you have any lab test that is abnormal or we need to change your treatment, we will call you to review the results. ? ?Testing/Procedures: ?Non-Cardiac CT scanning, (CAT scanning), is a noninvasive, special x-ray that produces cross-sectional images of the body using x-rays and a computer. CT scans help physicians diagnose and treat medical conditions. For some CT exams, a contrast material is used to enhance visibility in the area of the body being studied. CT scans provide greater clarity and reveal more details than regular x-ray exams. ? ?Please schedule for October of 2023 ? ?Follow-Up: ?At [facility], you and your health needs are our priority.  As part of our continuing mission to provide you with exceptional heart care, we have created designated Provider Care Teams.  These Care Teams include your primary Cardiologist (physician) and Advanced Practice Providers (APPs -  Physician Assistants and Nurse Practitioners) who all work together to provide you with the care you need, when you need it. ? ?Your next appointment:   ?6 month(s) ? ?The format for your next appointment:   ?In Person ? ?Provider:   ?Donato Heinz, MD   ? ?Other Instructions ? ? ?Important Information About Sugar ? ? ? ? ? ? ?

## 2022-04-04 NOTE — Progress Notes (Signed)
?Cardiology Office Note:   ? ?Date:  04/06/2022  ? ?ID:  Tracey Luna, DOB Jun 01, 1961, MRN [id number] ? ?PCP:  Aletha Halim., PA-C ?  ?[facility] Providers ?Cardiologist:  Donato Heinz, MD    ? ?Referring MD: Aletha Halim., PA-C  ? ?Chief Complaint  ?Patient presents with  ? Follow-up  ?  Seen for Dr. Gardiner Rhyme  ? ? ?History of Present Illness:   ? ?Tracey Luna is a 61 y.o. female with a hx of TIA, HTN, HLD, mitral valve prolapse, aortic dilatation, tobacco use, and COPD.  She was previously followed by Dr. Einar Gip and was last seen in June 2021.  Echocardiogram in July 2021 showed normal EF, mild LVH, grade 1 DD, mild MR.  Lexiscan Myoview obtained on 06/28/2019 demonstrated normal perfusion.  CT of the chest without contrast in January 2020 showed ascending aortic aneurysm measuring at 41 mm.  MRA of the chest in March 2021 showed stable ascending aortic dilatation measuring 41 mm.  She had COVID-19 infection in January 2022 and had a COPD exacerbation after this.  Patient was last seen by Dr. Gardiner Rhyme on 07/05/2021 at which time she reportedly occasional lightheadedness with standing.  She was still smoking at the time.  She does have a sister who has Marfan's and had her thoracic aortic aneurysm repaired.  She is unable to tolerate MRI due to claustrophobia.  Dr. Nechama Guard ordered a CTA which was performed on 09/12/2021 that showed 4.1 cm ascending aorta, 3.4 cm aortic arch and a 2.7 cm descending aorta.  Annual imaging was recommended.  Due to palpitation, she also underwent a 2-week heart monitor that showed 2 episodes of SVT, longest episode lasted 2 hours and 13 minutes with average rate of 100-110.  She was started on Toprol-XL 25 mg daily.  A sleep study was also recommended. ? ?Patient presents today for follow-up.  She denies any significant chest discomfort or worsening dyspnea.  She has no significant lower extremity edema on exam.  She has no orthopnea or PND.  She has not had any  significant palpitation.  I recommended continue on the current therapy.  She is due for repeat CTA in October of this year.  She can follow-up with Dr. Gardiner Rhyme after that. ? ?Past Medical History:  ?Diagnosis Date  ? Anxiety   ? Complication of anesthesia   ? COPD (chronic obstructive pulmonary disease) ([place])   ? GERD (gastroesophageal reflux disease)   ? GERD (gastroesophageal reflux disease) 09/24/2016  ? Hyperglycemia   ? Hyperlipidemia   ? Hypertension   ? PONV (postoperative nausea and vomiting)   ? Pre-diabetes   ? Stroke [facility])   ? TIA, no deficits  ? Varicose veins   ? Vitamin D deficiency   ? ? ?Past Surgical History:  ?Procedure Laterality Date  ? CHOLECYSTECTOMY    ? EXCISION ORAL TUMOR Right 05/20/2017  ? Procedure: EXCISION RIGHT BUCCAL MUCOSAL LESION;  Surgeon: Leta Baptist, MD;  Location: [place];  Service: ENT;  Laterality: Right;  ? ? ?Current Medications: ?Current Meds  ?Medication Sig  ? albuterol (VENTOLIN HFA) 108 (90 Base) MCG/ACT inhaler Inhale into the lungs as needed.  ? Fluticasone-Umeclidin-Vilant (TRELEGY ELLIPTA) 100-62.5-25 MCG/INH AEPB Inhale 1 puff into the lungs daily.  ? LORazepam (ATIVAN) 0.5 MG tablet Take 0.5 mg by mouth as needed.  ? metFORMIN (GLUCOPHAGE-XR) 500 MG 24 hr tablet Take one pill with your largest meal  ? NON [place]  ?  Anti-Inflammatory Cream- ?Diclofenac 3%, Baclofen 2%, Lidocaine 2% ?Apply 1-2 grams to affected area 3-4 times daily ?Qty. 120 gm ?3 refills  ? nystatin cream (MYCOSTATIN) nystatin 100,000 unit/gram topical cream ? APP EXT AA BID  ? omeprazole (PRILOSEC) 40 MG capsule Take 40 mg by mouth daily.  ? [DISCONTINUED] atorvastatin (LIPITOR) 40 MG tablet Take 1 tablet (40 mg total) by mouth daily.  ? [DISCONTINUED] hydrochlorothiazide (HYDRODIURIL) 25 MG tablet TAKE 1 TABLET(25 MG) BY MOUTH DAILY  ? [DISCONTINUED] losartan (COZAAR) 100 MG tablet TAKE 1 TABLET(100 MG) BY MOUTH DAILY  ? [DISCONTINUED] metoprolol succinate  (TOPROL-XL) 25 MG 24 hr tablet TAKE 1 TABLET(25 MG) BY MOUTH DAILY  ?  ? ?Allergies:   Avelox [moxifloxacin hcl in nacl], Penicillins, and Vancomycin  ? ?Social History  ? ?Socioeconomic History  ? Marital status: Married  ?  Spouse name: Not on file  ? Number of children: 3  ? Years of education: Not on file  ? Highest education level: Not on file  ?Occupational History  ? Not on file  ?Tobacco Use  ? Smoking status: Every Day  ?  Packs/day: 0.50  ?  Years: 30.00  ?  Pack years: 15.00  ?  Types: Cigarettes  ? Smokeless tobacco: Never  ? Tobacco comments:  ?  1/2 pack per day   ?Vaping Use  ? Vaping Use: Never used  ?Substance and Sexual Activity  ? Alcohol use: Yes  ?  Alcohol/week: 0.0 standard drinks  ?  Comment: occ  ? Drug use: No  ? Sexual activity: Not on file  ?Other Topics Concern  ? Not on file  ?Social History Narrative  ? Not on file  ? ?Social Determinants of Health  ? ?Financial Resource Strain: Not on file  ?Food Insecurity: Not on file  ?Transportation Needs: Not on file  ?Physical Activity: Not on file  ?Stress: Not on file  ?Social Connections: Not on file  ?  ? ?Family History: ?The patient's family history includes Bleeding Disorder in her mother; Diabetes in her brother; Heart attack in her father; Heart disease in her father, mother, and sister; Hyperlipidemia in her father; Hypertension in her brother, father, and sister; Varicose Veins in her mother. ? ?ROS:   ?Please see the history of present illness.    ? All other systems reviewed and are negative. ? ?EKGs/Labs/Other Studies Reviewed:   ? ?The following studies were reviewed today: ? ?Heart Monitor 08/03/2021 ?2 episodes of SVT, longest lasting 2 hours 13 minutes with average rate 118 bpm ?  ?  ?Patch Wear Time:  11 days and 15 hours (2022-08-13T19:18:31-0400 to 2022-08-25T10:35:43-0400) ?  ?Patient had a min HR of 47 bpm, max HR of 150 bpm, and avg HR of 86 bpm. Predominant underlying rhythm was Sinus Rhythm. First Degree AV Block was  present. 2 Supraventricular Tachycardia runs occurred, the run with the fastest interval lasting 2 hours 13 ? mins with a max rate of 150 bpm (avg 118 bpm); the run with the fastest interval was also the longest. Some episodes of Supraventricular Tachycardia may be possible Atrial Tachycardia with variable block. Isolated SVEs were rare (<1.0%), SVE Couplets  ?were rare (<1.0%), and SVE Triplets were rare (<1.0%). Isolated VEs were rare (<1.0%), VE Couplets were rare (<1.0%), and no VE Triplets were present. Ventricular Trigeminy was present.  Patient triggered events corresponded to sinus rhythm ? PVCs ?  ?Finalized by Donato Heinz, MD on Mon Aug 06, 2021  6:05 PM ?  ?  2 episodes of SVT, longest lasting 2 hours 30 minutes with average rate 118 bpm ?  ?  ?Patch Wear Time:  11 days and 15 hours (2022-08-13T19:18:31-0400 to 2022-08-25T10:35:43-0400) ?  ?Patient had a min HR of 47 bpm, max HR of 150 bpm, and avg HR of 86 bpm. Predominant underlying rhythm was Sinus Rhythm. First Degree AV Block was present. 2 Supraventricular Tachycardia runs occurred, the run with the fastest interval lasting 2 hours 13 ? mins with a max rate of 150 bpm (avg 118 bpm); the run with the fastest interval was also the longest. Some episodes of Supraventricular Tachycardia may be possible Atrial Tachycardia with variable block. Isolated SVEs were rare (<1.0%), SVE Couplets  ?were rare (<1.0%), and SVE Triplets were rare (<1.0%). Isolated VEs were rare (<1.0%), VE Couplets were rare (<1.0%), and no VE Triplets were present. Ventricular Trigeminy was present.  Patient triggered events corresponded to sinus rhythm ? PVCs ? ?EKG:  EKG is ordered today.  The ekg ordered today demonstrates normal sinus rhythm, no significant ST-T wave changes. ? ?Recent Labs: ?09/03/2021: BUN 20; Creatinine, Ser 0.77; Potassium 3.8; Sodium 138  ?Recent Lipid Panel ?No results found for: CHOL, TRIG, HDL, CHOLHDL, VLDL, LDLCALC, LDLDIRECT ? ? ?Risk  Assessment/Calculations:   ?  ? ?    ? ?Physical Exam:   ? ?VS:  BP 116/80   Pulse 80   Ht '5\' 5"'$  (1.651 m)   Wt 199 lb 9.6 oz (90.5 kg)   SpO2 94%   BMI 33.22 kg/m?    ? ?Wt Readings from Last 3 Encounters:  ?05/04

## 2022-04-05 ENCOUNTER — Ambulatory Visit: Payer: Commercial Managed Care - PPO | Admitting: Physician Assistant

## 2022-04-06 ENCOUNTER — Encounter: Payer: Self-pay | Admitting: Physician Assistant

## 2022-05-01 ENCOUNTER — Other Ambulatory Visit (HOSPITAL_COMMUNITY): Payer: Self-pay | Admitting: Family Medicine

## 2022-05-01 DIAGNOSIS — R053 Chronic cough: Secondary | ICD-10-CM

## 2022-05-02 ENCOUNTER — Ambulatory Visit (HOSPITAL_COMMUNITY)
Admission: RE | Admit: 2022-05-02 | Discharge: 2022-05-02 | Disposition: A | Discharge status: Home / Self Care | Payer: Commercial Managed Care - PPO | Source: Ambulatory Visit | Attending: Family Medicine | Admitting: Family Medicine

## 2022-05-02 DIAGNOSIS — R053 Chronic cough: Secondary | ICD-10-CM | POA: Insufficient documentation

## 2022-05-16 ENCOUNTER — Encounter: Payer: Self-pay | Admitting: Orthopaedic Surgery

## 2022-05-16 ENCOUNTER — Ambulatory Visit (INDEPENDENT_AMBULATORY_CARE_PROVIDER_SITE_OTHER): Payer: Commercial Managed Care - PPO | Admitting: Orthopaedic Surgery

## 2022-05-16 ENCOUNTER — Ambulatory Visit (INDEPENDENT_AMBULATORY_CARE_PROVIDER_SITE_OTHER): Payer: Commercial Managed Care - PPO

## 2022-05-16 VITALS — BP 149/88 | HR 88 | Ht 65.0 in | Wt 204.0 lb

## 2022-05-16 DIAGNOSIS — M25511 Pain in right shoulder: Secondary | ICD-10-CM | POA: Diagnosis not present

## 2022-05-16 MED ORDER — HYDROCODONE-ACETAMINOPHEN 5-325 MG PO TABS: ORAL_TABLET | ORAL | 0 refills | Status: DC

## 2022-05-16 NOTE — Patient Instructions (Addendum)
Aleve one pill twice daily, or you can use 2 pills twice daily  If you use ibuprofen instead use 3 pills three times daily   If you are better in one month ,ok to call and cancel appointment

## 2022-05-16 NOTE — Progress Notes (Signed)
Subjective:    Patient ID: Tracey Luna, female    DOB: 27-Jun-1961, 61 y.o.   MRN: [id number]  HPI She hurt her right shoulder lifting a heavy laundry basket last Friday, June 9th.  She had pain in the right shoulder that radiated to the mid upper arm.  She has no swelling, no redness, no numbness.  She has pain lifting overhead.  She has tried ice, heat and ibuprofen with some help.  She feels better today.   Review of Systems  Constitutional:  Positive for activity change.  Respiratory:  Positive for shortness of breath.   Musculoskeletal:  Positive for arthralgias and myalgias.  All other systems reviewed and are negative. For Review of Systems, all other systems reviewed and are negative.  The following is a summary of the past history medically, past history surgically, known current medicines, social history and family history.  This information is gathered electronically by the computer from prior information and documentation.  I review this each visit and have found including this information at this point in the chart is beneficial and informative.   Past Medical History:  Diagnosis Date   Anxiety    Complication of anesthesia    COPD (chronic obstructive pulmonary disease) (HCC)    GERD (gastroesophageal reflux disease)    GERD (gastroesophageal reflux disease) 09/24/2016   Hyperglycemia    Hyperlipidemia    Hypertension    PONV (postoperative nausea and vomiting)    Pre-diabetes    Stroke (H[facility])    TIA, no deficits   Varicose veins    Vitamin D deficiency     Past Surgical History:  Procedure Laterality Date   CHOLECYSTECTOMY     EXCISION ORAL TUMOR Right 05/20/2017   Procedure: EXCISION RIGHT BUCCAL MUCOSAL LESION;  Surgeon: Leta Baptist, MD;  Location: [place];  Service: ENT;  Laterality: Right;    Current Outpatient Medications on File Prior to Visit  Medication Sig Dispense Refill   albuterol (VENTOLIN HFA) 108 (90 Base) MCG/ACT inhaler  Inhale into the lungs as needed.     atorvastatin (LIPITOR) 40 MG tablet Take 1 tablet (40 mg total) by mouth daily. 90 tablet 3   Fluticasone-Umeclidin-Vilant (TRELEGY ELLIPTA) 100-62.5-25 MCG/INH AEPB Inhale 1 puff into the lungs daily. 1 each 0   hydrochlorothiazide (HYDRODIURIL) 25 MG tablet Take 1 tablet (25 mg total) by mouth daily. 90 tablet 3   LORazepam (ATIVAN) 0.5 MG tablet Take 0.5 mg by mouth as needed.     losartan (COZAAR) 100 MG tablet Take 1 tablet (100 mg total) by mouth daily. 90 tablet 3   metFORMIN (GLUCOPHAGE-XR) 500 MG 24 hr tablet Take one pill with your largest meal     metoprolol succinate (TOPROL-XL) 25 MG 24 hr tablet Take 1 tablet (25 mg total) by mouth daily. 90 tablet 3   NON [place]  Anti-Inflammatory Cream- Diclofenac 3%, Baclofen 2%, Lidocaine 2% Apply 1-2 grams to affected area 3-4 times daily Qty. 120 gm 3 refills     nystatin cream (MYCOSTATIN) nystatin 100,000 unit/gram topical cream  APP EXT AA BID     omeprazole (PRILOSEC) 40 MG capsule Take 40 mg by mouth daily.     No current facility-administered medications on file prior to visit.    Social History   Socioeconomic History   Marital status: Married    Spouse name: Not on file   Number of children: 3   Years of education: Not on file  Highest education level: Not on file  Occupational History   Not on file  Tobacco Use   Smoking status: Every Day    Packs/day: 0.50    Years: 30.00    Total pack years: 15.00    Types: Cigarettes   Smokeless tobacco: Never   Tobacco comments:    1/2 pack per day   Vaping Use   Vaping Use: Never used  Substance and Sexual Activity   Alcohol use: Yes    Alcohol/week: 0.0 standard drinks of alcohol    Comment: occ   Drug use: No   Sexual activity: Not on file  Other Topics Concern   Not on file  Social History Narrative   Not on file   Social Determinants of Health   Financial Resource Strain: Not on file  Food  Insecurity: Not on file  Transportation Needs: Not on file  Physical Activity: Not on file  Stress: Not on file  Social Connections: Not on file  Intimate Partner Violence: Not on file    Family History  Problem Relation Age of Onset   Heart disease Mother    Varicose Veins Mother    Bleeding Disorder Mother    Heart disease Father        before age 36   Hyperlipidemia Father    Hypertension Father    Heart attack Father    Heart disease Sister    Hypertension Sister    Diabetes Brother    Hypertension Brother     BP (!) 149/88   Pulse 88   Ht '5\' 5"'$  (1.651 m)   Wt 204 lb (92.5 kg)   BMI 33.95 kg/m   Body mass index is 33.95 kg/m.      Objective:   Physical Exam Vitals and nursing note reviewed. Exam conducted with a chaperone present.  Constitutional:      Appearance: She is well-developed.  HENT:     Head: Normocephalic and atraumatic.  Eyes:     Conjunctiva/sclera: Conjunctivae normal.     Pupils: Pupils are equal, round, and reactive to light.  Cardiovascular:     Rate and Rhythm: Normal rate and regular rhythm.  Pulmonary:     Effort: Pulmonary effort is normal.  Abdominal:     Palpations: Abdomen is soft.  Musculoskeletal:       Arms:     Cervical back: Normal range of motion and neck supple.  Skin:    General: Skin is warm and dry.  Neurological:     Mental Status: She is alert and oriented to person, place, and time.     Cranial Nerves: No cranial nerve deficit.     Motor: No abnormal muscle tone.     Coordination: Coordination normal.     Deep Tendon Reflexes: Reflexes are normal and symmetric. Reflexes normal.  Psychiatric:        Behavior: Behavior normal.        Thought Content: Thought content normal.        Judgment: Judgment normal.  X-rays were done of the right shoulder, reported separately.        Assessment & Plan:   Encounter Diagnosis  Name Primary?   Acute pain of right shoulder Yes   She has been on prednisone  recently for three doses for lung problems.  She finished her last dose pack this morning.  I told her it would not be beneficial to consider injection of right shoulder.  She is slowly getting better and I  feel it is more to overuse syndrome.  She asked about rotator cuff and I told her that does not show up on plain X-rays.  That is a possibility but time will tell.  I have given samples of Aleve, one bid pc.  I have called in pain medicine for a few days.  I have reviewed the [place] web site prior to prescribing narcotic medicine for this patient.  Return in one month.  Call and cancel if doing better.  Call if any problem.  Precautions discussed.  Electronically Signed Sanjuana Kava, MD 6/15/20239:26 AM

## 2022-06-05 ENCOUNTER — Telehealth: Payer: Self-pay | Admitting: Cardiology

## 2022-06-05 NOTE — Telephone Encounter (Signed)
Left message to call back  

## 2022-06-05 NOTE — Telephone Encounter (Signed)
Spoke with pt regarding increased palpitation over the last 10 to 14 days. Pt states that they do feel similar to the palpitations that she was having last year when we placed a monitor but frequency has increased. Discussed stimulants with pt who does admit to coffee, alcohol and cigarette smoking. Pt would like to be seen in the office. Appointment scheduled for pt with Almyra Deforest on 7/7. Pt verbalizes understanding.

## 2022-06-05 NOTE — Telephone Encounter (Signed)
Patient c/o Palpitations:  High priority if patient c/o lightheadedness, shortness of breath, or chest pain  How long have you had palpitations/irregular HR/ Afib? Palpitations for the past two weeks. Are you having the symptoms now? Yes, she is a work.   Are you currently experiencing lightheadedness, SOB or CP? no  Do you have a history of afib (atrial fibrillation) or irregular heart rhythm? Yes, she has history of irregular heart rhythm  Have you checked your BP or HR? (document readings if available): right now it's 113, states that even at resting it's in the 90's  Are you experiencing any other symptoms? At times feels dizzy when she has the palpitations. She also has been diagnosed with COPD, she has been dealing with a cough for the past few weeks.

## 2022-06-07 ENCOUNTER — Ambulatory Visit: Payer: Commercial Managed Care - PPO | Admitting: Physician Assistant

## 2022-06-12 ENCOUNTER — Ambulatory Visit: Payer: Commercial Managed Care - PPO | Admitting: Orthopaedic Surgery

## 2022-06-26 ENCOUNTER — Ambulatory Visit: Payer: Self-pay

## 2022-07-02 ENCOUNTER — Ambulatory Visit (INDEPENDENT_AMBULATORY_CARE_PROVIDER_SITE_OTHER): Payer: Commercial Managed Care - PPO

## 2022-07-02 ENCOUNTER — Ambulatory Visit (INDEPENDENT_AMBULATORY_CARE_PROVIDER_SITE_OTHER): Payer: Commercial Managed Care - PPO | Admitting: Orthopaedic Surgery

## 2022-07-02 ENCOUNTER — Encounter: Payer: Self-pay | Admitting: Orthopaedic Surgery

## 2022-07-02 VITALS — BP 148/91 | HR 85 | Ht 64.0 in | Wt 205.0 lb

## 2022-07-02 DIAGNOSIS — M79676 Pain in unspecified toe(s): Secondary | ICD-10-CM

## 2022-07-02 DIAGNOSIS — S92515A Nondisplaced fracture of proximal phalanx of left lesser toe(s), initial encounter for closed fracture: Secondary | ICD-10-CM | POA: Diagnosis not present

## 2022-07-02 DIAGNOSIS — M79675 Pain in left toe(s): Secondary | ICD-10-CM

## 2022-07-02 NOTE — Progress Notes (Signed)
My toe hurts  She hurt her left little toe about six weeks ago at home.  She has had continued pain and swelling.  She is a diabetic.  She has no new trauma.  She has worn shoes to accommodate her pain.  She is worried it still hurts.  Diabetes is under good control.  Left little toe has some slight swelling, NV intact, no redness, no wound, no ulceration.  X-rays were done of the left foot, reported separately.  Encounter Diagnoses  Name Primary?   Pain of fifth toe Yes   Closed nondisplaced fracture of proximal phalanx of lesser toe of left foot, initial encounter    I have shown her the X-rays.  Her fracture is nondisplaced and has healing.  She can wear shoes as she has been doing.  It is healing.  I will see her in two weeks.  X-rays then.  Call if any problem.  Precautions discussed.  Electronically Signed Sanjuana Kava, MD 8/1/20238:54 AM

## 2022-07-02 NOTE — Patient Instructions (Signed)
Steps to Quit Smoking Smoking tobacco is the leading cause of preventable death. It can affect almost every organ in the body. Smoking puts you and people around you at risk for many serious, long-lasting (chronic) diseases. Quitting smoking can be hard, but it is one of the best things that you can do for your health. It is never too late to quit. Do not give up if you cannot quit the first time. Some people need to try many times to quit. Do your best to stick to your quit plan, and talk with your doctor if you have any questions or concerns. How do I get ready to quit? Pick a date to quit. Set a date within the next 2 weeks to give you time to prepare. Write down the reasons why you are quitting. Keep this list in places where you will see it often. Tell your family, friends, and co-workers that you are quitting. Their support is important. Talk with your doctor about the choices that may help you quit. Find out if your health insurance will pay for these treatments. Know the people, places, things, and activities that make you want to smoke (triggers). Avoid them. What first steps can I take to quit smoking? Throw away all cigarettes at home, at work, and in your car. Throw away the things that you use when you smoke, such as ashtrays and lighters. Clean your car. Empty the ashtray. Clean your home, including curtains and carpets. What can I do to help me quit smoking? Talk with your doctor about taking medicines and seeing a counselor. You are more likely to succeed when you do both. If you are pregnant or breastfeeding: Talk with your doctor about counseling or other ways to quit smoking. Do not take medicine to help you quit smoking unless your doctor tells you to. Quit right away Quit smoking completely, instead of slowly cutting back on how much you smoke over a period of time. Stopping smoking right away may be more successful than slowly quitting. Go to counseling. In-person is best  if this is an option. You are more likely to quit if you go to counseling sessions regularly. Take medicine You may take medicines to help you quit. Some medicines need a prescription, and some you can buy over-the-counter. Some medicines may contain a drug called nicotine to replace the nicotine in cigarettes. Medicines may: Help you stop having the desire to smoke (cravings). Help to stop the problems that come when you stop smoking (withdrawal symptoms). Your doctor may ask you to use: Nicotine patches, gum, or lozenges. Nicotine inhalers or sprays. Non-nicotine medicine that you take by mouth. Find resources Find resources and other ways to help you quit smoking and remain smoke-free after you quit. They include: Online chats with a counselor. Phone quitlines. Printed self-help materials. Support groups or group counseling. Text messaging programs. Mobile phone apps. Use apps on your mobile phone or tablet that can help you stick to your quit plan. Examples of free services include Quit Guide from the CDC and smokefree.gov  What can I do to make it easier to quit?  Talk to your family and friends. Ask them to support and encourage you. Call a phone quitline, such as 1-800-QUIT-NOW, reach out to support groups, or work with a counselor. Ask people who smoke to not smoke around you. Avoid places that make you want to smoke, such as: Bars. Parties. Smoke-break areas at work. Spend time with people who do not smoke. Lower   the stress in your life. Stress can make you want to smoke. Try these things to lower stress: Getting regular exercise. Doing deep-breathing exercises. Doing yoga. Meditating. What benefits will I see if I quit smoking? Over time, you may have: A better sense of smell and taste. Less coughing and sore throat. A slower heart rate. Lower blood pressure. Clearer skin. Better breathing. Fewer sick days. Summary Quitting smoking can be hard, but it is one of  the best things that you can do for your health. Do not give up if you cannot quit the first time. Some people need to try many times to quit. When you decide to quit smoking, make a plan to help you succeed. Quit smoking right away, not slowly over a period of time. When you start quitting, get help and support to keep you smoke-free. This information is not intended to replace advice given to you by your health care provider. Make sure you discuss any questions you have with your health care provider. Document Revised: 11/09/2021 Document Reviewed: 11/09/2021 Elsevier Patient Education  2023 Else[employer] Inc.  

## 2022-07-17 ENCOUNTER — Ambulatory Visit: Payer: Commercial Managed Care - PPO | Admitting: Orthopaedic Surgery

## 2022-07-30 ENCOUNTER — Other Ambulatory Visit: Payer: Self-pay | Admitting: *Deleted

## 2022-07-30 DIAGNOSIS — M79605 Pain in left leg: Secondary | ICD-10-CM

## 2022-07-30 DIAGNOSIS — M79604 Pain in right leg: Secondary | ICD-10-CM

## 2022-08-07 ENCOUNTER — Telehealth: Payer: Self-pay

## 2022-08-07 NOTE — Telephone Encounter (Signed)
Pt called to ask if having a steroid injection in her knee prior to coming to [place] this week would impact her reflux study. I have advised her from our perspective I do not think it would be an issue but to verify with orthopedic office that she is going to. Pt verbalized understanding.

## 2022-08-08 NOTE — Progress Notes (Signed)
Office Note     CC: Lower extremity swelling, left greater than right Requesting Provider:  Aletha Halim., PA-C  HPI: Tracey Luna is a 61 y.o. (Mar 01, 1961) female who presents at the request of Aletha Halim., PA-C for evaluation of lateral lower extremity swelling, left greater than right.  On exam today, Tracey Luna was doing well.  Tracey Luna from [place], she moved to [place] for her husband's work.  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 [name] noted left lower extremity swelling for a number of years, but states it is gotten worse over the last several months.  Is accompanied by tired, heavy feeling by days and.  There is some itching at the level of the ankle with swelling.  No bleeding, no ulceration.  No previous history of DVT, no previous history of venous procedures. Patient has not attempted compression stockings   Past Medical History:  Diagnosis Date   Anxiety    Complication of anesthesia    COPD (chronic obstructive pulmonary disease) (HCC)    GERD (gastroesophageal reflux disease)    GERD (gastroesophageal reflux disease) 09/24/2016   Hyperglycemia    Hyperlipidemia    Hypertension    PONV (postoperative nausea and vomiting)    Pre-diabetes    Stroke (HCC)    TIA, no deficits   Varicose veins    Vitamin D deficiency     Past Surgical History:  Procedure Laterality Date   CHOLECYSTECTOMY     EXCISION ORAL TUMOR Right 05/20/2017   Procedure: EXCISION RIGHT BUCCAL MUCOSAL LESION;  Surgeon: Leta Baptist, MD;  Location: [facility];  Service: ENT;  Laterality: Right;    Social History   Socioeconomic History   Marital status: Married    Spouse name: Not on file   Number of children: 3   Years of education: Not on file   Highest education level: Not on file  Occupational History   Not on file  Tobacco Use   Smoking status: Every Day    Packs/day: 0.50    Years: 30.00    Total pack years: 15.00     Types: Cigarettes   Smokeless tobacco: Never   Tobacco comments:    1/2 pack per day   Vaping Use   Vaping Use: Never used  Substance and Sexual Activity   Alcohol use: Yes    Alcohol/week: 0.0 standard drinks of alcohol    Comment: occ   Drug use: No   Sexual activity: Not on file  Other Topics Concern   Not on file  Social History Narrative   Not on file   Social Determinants of Health   Financial Resource Strain: Not on file  Food Insecurity: Not on file  Transportation Needs: Not on file  Physical Activity: Not on file  Stress: Not on file  Social Connections: Not on file  Intimate Partner Violence: Not on file   Family History  Problem Relation Age of Onset   Heart disease Mother    Varicose Veins Mother    Bleeding Disorder Mother    Heart disease Father        before age 98   Hyperlipidemia Father    Hypertension Father    Heart attack Father    Heart disease Sister    Hypertension Sister    Diabetes Brother    Hypertension Brother     Current Outpatient Medications  Medication Sig Dispense Refill   albuterol (VENTOLIN HFA) 108 (90 Base)  MCG/ACT inhaler Inhale into the lungs as needed.     atorvastatin (LIPITOR) 40 MG tablet Take 1 tablet (40 mg total) by mouth daily. 90 tablet 3   Fluticasone-Umeclidin-Vilant (TRELEGY ELLIPTA) 100-62.5-25 MCG/INH AEPB Inhale 1 puff into the lungs daily. 1 each 0   hydrochlorothiazide (HYDRODIURIL) 25 MG tablet Take 1 tablet (25 mg total) by mouth daily. 90 tablet 3   HYDROcodone-acetaminophen (NORCO/VICODIN) 5-325 MG tablet One tablet every four hours for pain. 30 tablet 0   LORazepam (ATIVAN) 0.5 MG tablet Take 0.5 mg by mouth as needed.     losartan (COZAAR) 100 MG tablet Take 1 tablet (100 mg total) by mouth daily. 90 tablet 3   metFORMIN (GLUCOPHAGE-XR) 500 MG 24 hr tablet Take one pill with your largest meal     metoprolol succinate (TOPROL-XL) 25 MG 24 hr tablet Take 1 tablet (25 mg total) by mouth daily. 90 tablet  3   NON [place]  Anti-Inflammatory Cream- Diclofenac 3%, Baclofen 2%, Lidocaine 2% Apply 1-2 grams to affected area 3-4 times daily Qty. 120 gm 3 refills     nystatin cream (MYCOSTATIN) nystatin 100,000 unit/gram topical cream  APP EXT AA BID     omeprazole (PRILOSEC) 40 MG capsule Take 40 mg by mouth daily.     No current facility-administered medications for this visit.    Allergies  Allergen Reactions   Avelox [Moxifloxacin Hcl In Nacl] Rash   Penicillins Rash   Vancomycin Rash     REVIEW OF SYSTEMS:   '[X]'$  denotes positive finding, '[ ]'$  denotes negative finding Cardiac  Comments:  Chest pain or chest pressure:    Shortness of breath upon exertion:    Short of breath when lying flat:    Irregular heart rhythm:        Vascular    Pain in calf, thigh, or hip brought on by ambulation:    Pain in feet at night that wakes you up from your sleep:     Blood clot in your veins:    Leg swelling:         Pulmonary    Oxygen at home:    Productive cough:     Wheezing:         Neurologic    Sudden weakness in arms or legs:     Sudden numbness in arms or legs:     Sudden onset of difficulty speaking or slurred speech:    Temporary loss of vision in one eye:     Problems with dizziness:         Gastrointestinal    Blood in stool:     Vomited blood:         Genitourinary    Burning when urinating:     Blood in urine:        Psychiatric    Major depression:         Hematologic    Bleeding problems:    Problems with blood clotting too easily:        Skin    Rashes or ulcers:        Constitutional    Fever or chills:      PHYSICAL EXAMINATION:  There were no vitals filed for this visit.  General:  WDWN in NAD; vital signs documented above Gait: Not observed HENT: WNL, normocephalic Pulmonary: normal non-labored breathing , without Rales, rhonchi,  wheezing Cardiac: regular HR Abdomen: soft, NT, no masses Skin: without  rashes Vascular Exam/Pulses:  Right Left  Radial 2+ (normal) 2+ (normal)  Ulnar 2+ (normal) 2+ (normal)  Femoral    Popliteal    DP 2+ (normal) 2+ (normal)  PT 2+ (normal) 2+ (normal)   Extremities: without ischemic changes, without Gangrene , without cellulitis; without open wounds;  Musculoskeletal: no muscle wasting or atrophy  Neurologic: A&O X 3;  No focal weakness or paresthesias are detected Psychiatric:  The pt has Normal affect.   Non-Invasive Vascular Imaging:    Summary:  Left:  - No evidence of deep vein thrombosis seen in the left lower extremity,  from the common femoral through the popliteal veins.  - No evidence of superficial venous thrombosis in the left lower  extremity.  - Deep vein reflux in the CFV and proximal FV.  - Superficial vein reflux in the SSV, SFJ, and GSV.        ASSESSMENT/PLAN:: 61 y.o. female presenting with left lower extremity chronic venous insufficiency.  Ultrasound was reviewed demonstrating reflux throughout the greater saphenous vein.  Emiliya has not tried compression stockings.  I had a long conversation with her regarding the above.  My plan is to see her back in the office in 3 months time with trial of compression stockings.  At that time, she will see my partner, Dr. Gae Gallop who performs our venous procedures to discuss radiofrequency ablation.   Broadus John, MD [facility] [phone]

## 2022-08-09 ENCOUNTER — Encounter: Payer: Self-pay | Admitting: Vascular Surgery

## 2022-08-09 ENCOUNTER — Ambulatory Visit (INDEPENDENT_AMBULATORY_CARE_PROVIDER_SITE_OTHER): Payer: Commercial Managed Care - PPO | Admitting: Vascular Surgery

## 2022-08-09 ENCOUNTER — Ambulatory Visit (HOSPITAL_COMMUNITY)
Admission: RE | Admit: 2022-08-09 | Discharge: 2022-08-09 | Disposition: A | Discharge status: Home / Self Care | Payer: Commercial Managed Care - PPO | Source: Ambulatory Visit | Attending: Vascular Surgery | Admitting: Vascular Surgery

## 2022-08-09 VITALS — BP 130/85 | HR 76 | Temp 98.0°F | Resp 20 | Ht 64.0 in | Wt 200.0 lb

## 2022-08-09 DIAGNOSIS — M79605 Pain in left leg: Secondary | ICD-10-CM | POA: Insufficient documentation

## 2022-08-09 DIAGNOSIS — M79604 Pain in right leg: Secondary | ICD-10-CM

## 2022-08-09 DIAGNOSIS — I82722 Chronic embolism and thrombosis of deep veins of left upper extremity: Secondary | ICD-10-CM

## 2022-08-09 DIAGNOSIS — I8393 Asymptomatic varicose veins of bilateral lower extremities: Secondary | ICD-10-CM

## 2022-10-08 ENCOUNTER — Ambulatory Visit (HOSPITAL_COMMUNITY)
Admission: RE | Admit: 2022-10-08 | Discharge: 2022-10-08 | Disposition: A | Discharge status: Home / Self Care | Payer: Commercial Managed Care - PPO | Source: Ambulatory Visit | Attending: Physician Assistant | Admitting: Physician Assistant

## 2022-10-08 DIAGNOSIS — I7121 Aneurysm of the ascending aorta, without rupture: Secondary | ICD-10-CM | POA: Insufficient documentation

## 2022-10-08 LAB — POCT I-STAT CREATININE: Creatinine, Ser: 1.2 mg/dL — ABNORMAL HIGH (ref 0.44–1.00)

## 2022-10-08 MED ORDER — IOHEXOL 350 MG/ML SOLN
100.0000 mL | Freq: Once | INTRAVENOUS | Status: AC | PRN
  Administered 2022-10-08: 75 mL via INTRAVENOUS

## 2022-10-08 NOTE — Progress Notes (Signed)
Previous CT in 2022 showed 4.1 cm ascending aorta, 3.4 cm aortic arch and a 2.7 cm descending aorta, this year's CT showed 4.1 ascending aorta, 3.1 cm aortic arch and 2.7 descending aorta. Both the ascending and descending aorta has not changed compare to last year, the size of aortic arch is actually a smaller compare to last year. This is a very reassuring study, will continue with annual imaging. Incidental findings of 1 cm right thyroid nodule and 2 cm right renal cyst, no follow up was recommended by the radiologist given small size.

## 2022-10-08 NOTE — Progress Notes (Signed)
Point of care creatinine obtained prior to CTA of chest. Please inform the patient after her CT scan, hold metformin and HCTZ for 2 days while drinking more fluid to hydrate the kidney.

## 2022-10-14 NOTE — Progress Notes (Unsigned)
Cardiology Office Note:    Date:  07/05/2021   ID:  Tracey Luna, DOB [birth year], MRN [id number]  PCP:  Aletha Halim., PA-C  Cardiologist:  None  Electrophysiologist:  None   Referring MD: Aletha Halim., PA-C   Chief Complaint  Patient presents with   Palpitations     History of Present Illness:    Tracey Luna is a 61 y.o. female with a hx of TIA, hypertension, hyperlipidemia, mitral valve prolapse, aortic dilatation, tobacco use, COPD who presents for follow-up.  She was referred by Bing Matter, PA for evaluation of aortic dilatation, initially seen 07/05/2021.  She previously followed with Dr. Einar Gip in cardiology, last seen 05/04/20.  Echocardiogram on 06/15/2020 showed normal LV systolic function, mild LVH, grade 1 diastolic dysfunction, mild MR.  Lexiscan Myoview on 06/28/2019 showed normal perfusion.  CT chest without contrast 12/04/2018 showed ascending aortic dilatation measuring 41 mm.  MRA chest 02/23/2020 showed stable ascending aortic dilatation measuring 41 mm.  She reports intermittent chest pain that she describes as sharp pain that lasts for few seconds and resolves.  Reports occasional shortness of breath.  She had COVID-19 infection in January 2022.  Had COPD exacerbation after this.  Reports occasional lightheadedness with standing, denies any syncope.  Reports intermittent lower extremity edema.  Reports having palpitations occur about once per week, can last for hours.  Smokes 0.5 to 0.75 ppd, has smoked for over 40 years.  Family history includes both her sister and mother have 61 and her sister had her thoracic aortic aneurysm repaired.  Her father has had multiple MIs, first in his 14s, and had a pacemaker.  Zio patch x12 days on 08/03/2021 showed 2 episodes of SVT, longest lasting 2 hours 13 minutes with average rate 118 bpm.  Started on Toprol-XL 25 mg daily.  Since last clinic visit,  Past Medical History:  Diagnosis Date   Anxiety    Complication  of anesthesia    COPD (chronic obstructive pulmonary disease) (HCC)    GERD (gastroesophageal reflux disease)    GERD (gastroesophageal reflux disease) 09/24/2016   Hyperglycemia    Hyperlipidemia    Hypertension    PONV (postoperative nausea and vomiting)    Pre-diabetes    Stroke (HCC)    TIA, no deficits   Varicose veins    Vitamin D deficiency     Past Surgical History:  Procedure Laterality Date   CHOLECYSTECTOMY     EXCISION ORAL TUMOR Right 05/20/2017   Procedure: EXCISION RIGHT BUCCAL MUCOSAL LESION;  Surgeon: Leta Baptist, MD;  Location: [place];  Service: ENT;  Laterality: Right;    Current Medications: Current Meds  Medication Sig   atorvastatin (LIPITOR) 40 MG tablet Take 1 tablet (40 mg total) by mouth daily.   Fluticasone-Umeclidin-Vilant (TRELEGY ELLIPTA) 100-62.5-25 MCG/INH AEPB Inhale 1 puff into the lungs daily.   hydrochlorothiazide (HYDRODIURIL) 25 MG tablet TAKE 1 TABLET(25 MG) BY MOUTH DAILY   losartan (COZAAR) 100 MG tablet TAKE 1 TABLET(100 MG) BY MOUTH DAILY   metFORMIN (GLUCOPHAGE-XR) 500 MG 24 hr tablet Take one pill with your largest meal   NON FORMULARY [facility]  Anti-Inflammatory Cream- Diclofenac 3%, Baclofen 2%, Lidocaine 2% Apply 1-2 grams to affected area 3-4 times daily Qty. 120 gm 3 refills   nystatin cream (MYCOSTATIN) nystatin 100,000 unit/gram topical cream  APP EXT AA BID   omeprazole (PRILOSEC) 40 MG capsule Take 40 mg by mouth daily.     Allergies:  Avelox [moxifloxacin hcl in nacl], Penicillins, and Vancomycin   Social History   Socioeconomic History   Marital status: Married    Spouse name: Not on file   Number of children: 3   Years of education: Not on file   Highest education level: Not on file  Occupational History   Not on file  Tobacco Use   Smoking status: Every Day    Packs/day: 0.50    Years: 30.00    Pack years: 15.00    Types: Cigarettes   Smokeless tobacco: Never   Tobacco  comments:    1/2 pack per day   Vaping Use   Vaping Use: Never used  Substance and Sexual Activity   Alcohol use: Yes    Alcohol/week: 0.0 standard drinks    Comment: occ   Drug use: No   Sexual activity: Not on file  Other Topics Concern   Not on file  Social History Narrative   Not on file   Social Determinants of Health   Financial Resource Strain: Not on file  Food Insecurity: Not on file  Transportation Needs: Not on file  Physical Activity: Not on file  Stress: Not on file  Social Connections: Not on file     Family History: The patient's family history includes Bleeding Disorder in her mother; Diabetes in her brother; Heart attack in her father; Heart disease in her father, mother, and sister; Hyperlipidemia in her father; Hypertension in her brother, father, and sister; Varicose Veins in her mother.  ROS:   Please see the history of present illness.     All other systems reviewed and are negative.  EKGs/Labs/Other Studies Reviewed:    The following studies were reviewed today:   EKG:  EKG is ordered today.  The ekg ordered today demonstrates normal sinus rhythm, rate 83, no ST/T abnormality  Recent Labs: No results found for requested labs within last 8760 hours.  Recent Lipid Panel No results found for: CHOL, TRIG, HDL, CHOLHDL, VLDL, LDLCALC, LDLDIRECT  Physical Exam:    VS:  BP 128/82 (BP Location: Left Arm, Patient Position: Sitting, Cuff Size: Large)   Pulse 83   Ht 5' 4.5" (1.638 m)   Wt 196 lb 12.8 oz (89.3 kg)   SpO2 98%   BMI 33.26 kg/m     Wt Readings from Last 3 Encounters:  07/05/21 196 lb 12.8 oz (89.3 kg)  12/28/20 196 lb (88.9 kg)  05/04/20 199 lb (90.3 kg)     GEN:  Well nourished, well developed in no acute distress HEENT: Normal NECK: No JVD; No carotid bruits LYMPHATICS: No lymphadenopathy CARDIAC: RRR, no murmurs, rubs, gallops RESPIRATORY:  Clear to auscultation without rales, wheezing or rhonchi  ABDOMEN: Soft,  non-tender, non-distended MUSCULOSKELETAL:  No edema; No deformity  SKIN: Warm and dry NEUROLOGIC:  Alert and oriented x 3 PSYCHIATRIC:  Normal affect   ASSESSMENT:    1. Aortic dilatation (HCC)   2. Snoring   3. Daytime somnolence   4. Palpitations   5. Essential hypertension   6. Tobacco use   7. Hyperlipidemia, unspecified hyperlipidemia type    PLAN:     Aortic dilatation: Measured 41 mm on MRA chest 02/23/2020.  She reports she is not able to tolerate MRIs due to claustrophobia.  CTA chest 10/08/2022 showed stable 41 mm ascending aorta  Hypertension: On losartan 100 mg daily, hydrochlorothiazide 25 mg daily.  Appears controlled  Hyperlipidemia: On atorvastatin 40 mg daily.  LDL 96 on 05/09/2020  Tobacco use: Patient counseled on the risk of tobacco use and cessation strongly encouraged  SVT: Zio patch x12 days on 08/03/2021 showed 2 episodes of SVT, longest lasting 2 hours 13 minutes with average rate 118 bpm.  Continue Toprol-XL 25 mg daily.  Daytime somnolence/snoring: Will check sleep study  T2DM: On metformin.  A1c 7.6 on 02/06/2021  RTC in 6 months    Medication Adjustments/Labs and Tests Ordered: Current medicines are reviewed at length with the patient today.  Concerns regarding medicines are outlined above.  Orders Placed This Encounter  Procedures   CT ANGIO CHEST AORTA W/ & OR WO/CM & GATING ([place] ONLY)   Basic metabolic panel   LONG TERM MONITOR (3-14 DAYS)   EKG 12-Lead   Home sleep test    No orders of the defined types were placed in this encounter.   Patient Instructions  Medication Instructions:  No Changes In Medications at this time.  *If you need a refill on your cardiac medications before your next appointment, please call your pharmacy*  Lab Work: BMET- TODAY  If you have labs (blood work) drawn today and your tests are completely normal, you will receive your results only by: [place] (if you have MyChart) OR A paper copy in  the mail If you have any lab test that is abnormal or we need to change your treatment, we will call you to review the results.  Testing/Procedures: CTA CHEST- SOMEONE WILL REACH OUT YOU TO GET THIS SCHEDULED   ZIO XT- Long Term Monitor Instructions   Your physician has requested you wear your ZIO patch monitor 14 days.   This is a single patch monitor.  [employer] supplies one patch monitor per enrollment.  Additional stickers are not available.   Please do not apply patch if you will be having a Nuclear Stress Test, Echocardiogram, Cardiac CT, MRI, or Chest Xray during the time frame you would be wearing the monitor. The patch cannot be worn during these tests.  You cannot remove and re-apply the ZIO XT patch monitor.   Your ZIO patch monitor will be sent USPS Priority mail from [facility] directly to your home address. The monitor may also be mailed to a PO BOX if home delivery is not available.   It may take 3-5 days to receive your monitor after you have been enrolled.   Once you have received you monitor, please review enclosed instructions.  Your monitor has already been registered assigning a specific monitor serial # to you.   Applying the monitor   Shave hair from upper left chest.   Hold abrader disc by orange tab.  Rub abrader in 40 strokes over left upper chest as indicated in your monitor instructions.   Clean area with 4 enclosed alcohol pads .  Use all pads to assure are is cleaned thoroughly.  Let dry.   Apply patch as indicated in monitor instructions.  Patch will be place under collarbone on left side of chest with arrow pointing upward.   Rub patch adhesive wings for 2 minutes.Remove white label marked "1".  Remove white label marked "2".  Rub patch adhesive wings for 2 additional minutes.   While looking in a mirror, press and release button in center of patch.  A small green light will flash 3-4 times .  This will be your only indicator the monitor has been  turned on.     Do not shower for the first 24 hours.  You may  shower after the first 24 hours.   Press button if you feel a symptom. You will hear a small click.  Record Date, Time and Symptom in the Patient Log Book.   When you are ready to remove patch, follow instructions on last 2 pages of Patient Log Book.  Stick patch monitor onto last page of Patient Log Book.   Place Patient Log Book in [place] box.  Use locking tab on box and tape box closed securely.  The [employer] has [employer] on it.  Please place in mailbox as soon as possible.  Your physician should have your test results approximately 7 days after the monitor has been mailed back to [facility].   Call [place] at [phone] if you have questions regarding your ZIO XT patch monitor.  Call them immediately if you see an orange light blinking on your monitor.   If your monitor falls off in less than 4 days contact our Monitor department at [phone].  If your monitor becomes loose or falls off after 4 days call [employer] at [phone] for suggestions on securing your monitor.    Your physician has recommended that you have a sleep study. This test records several body functions during sleep, including: brain activity, eye movement, oxygen and carbon dioxide blood levels, heart rate and rhythm, breathing rate and rhythm, the flow of air through your mouth and nose, snoring, body muscle movements, and chest and belly movement. SOMEONE WILL REACH OUT TO YOU TO GET THIS SCHEDULED   Follow-Up: At [facility], you and your health needs are our priority.  As part of our continuing mission to provide you with exceptional heart care, we have created designated Provider Care Teams.  These Care Teams include your primary Cardiologist (physician) and Advanced Practice Providers (APPs -  Physician Assistants and Nurse Practitioners) who all work together to provide you with the care you need, when you  need it.  Your next appointment:   6 month(s)  The format for your next appointment:   In Person  Provider:   Oswaldo Milian, MD  Other Instructions THE CARE GUIDE WILL REACH OUT TO YOU REGARDING SMOKING CESSATION    Signed, Donato Heinz, MD  07/05/2021 6:14 PM    [facility]

## 2022-10-15 ENCOUNTER — Telehealth: Payer: Self-pay

## 2022-10-15 ENCOUNTER — Ambulatory Visit: Payer: Commercial Managed Care - PPO | Attending: Cardiology | Admitting: Cardiology

## 2022-10-15 ENCOUNTER — Encounter: Payer: Self-pay | Admitting: Cardiology

## 2022-10-15 VITALS — BP 148/86 | HR 69 | Ht 64.0 in | Wt 202.6 lb

## 2022-10-15 DIAGNOSIS — I471 Supraventricular tachycardia, unspecified: Secondary | ICD-10-CM | POA: Diagnosis not present

## 2022-10-15 DIAGNOSIS — Z0000 Encounter for general adult medical examination without abnormal findings: Secondary | ICD-10-CM

## 2022-10-15 DIAGNOSIS — Z720 Tobacco use: Secondary | ICD-10-CM

## 2022-10-15 DIAGNOSIS — I10 Essential (primary) hypertension: Secondary | ICD-10-CM | POA: Diagnosis not present

## 2022-10-15 DIAGNOSIS — E785 Hyperlipidemia, unspecified: Secondary | ICD-10-CM

## 2022-10-15 DIAGNOSIS — R079 Chest pain, unspecified: Secondary | ICD-10-CM | POA: Diagnosis not present

## 2022-10-15 DIAGNOSIS — R011 Cardiac murmur, unspecified: Secondary | ICD-10-CM

## 2022-10-15 LAB — BASIC METABOLIC PANEL
BUN/Creatinine Ratio: 16 (ref 12–28)
BUN: 13 mg/dL (ref 8–27)
CO2: 27 mmol/L (ref 20–29)
Calcium: 9.7 mg/dL (ref 8.7–10.3)
Chloride: 96 mmol/L (ref 96–106)
Creatinine, Ser: 0.81 mg/dL (ref 0.57–1.00)
Glucose: 183 mg/dL — ABNORMAL HIGH (ref 70–99)
Potassium: 3.5 mmol/L (ref 3.5–5.2)
Sodium: 138 mmol/L (ref 134–144)
eGFR: 82 mL/min/{1.73_m2} (ref >59)

## 2022-10-15 LAB — LIPID PANEL
Chol/HDL Ratio: 4.8 ratio — ABNORMAL HIGH (ref 0.0–4.4)
Cholesterol, Total: 178 mg/dL (ref 100–199)
HDL: 37 mg/dL — ABNORMAL LOW (ref >39)
LDL Chol Calc (NIH): 113 mg/dL — ABNORMAL HIGH (ref 0–99)
Triglycerides: 158 mg/dL — ABNORMAL HIGH (ref 0–149)
VLDL Cholesterol Cal: 28 mg/dL (ref 5–40)

## 2022-10-15 LAB — MAGNESIUM: Magnesium: 1.2 mg/dL — ABNORMAL LOW (ref 1.6–2.3)

## 2022-10-15 MED ORDER — METOPROLOL TARTRATE 50 MG PO TABS: ORAL_TABLET | ORAL | 0 refills | Status: DC

## 2022-10-15 MED ORDER — CARVEDILOL 6.25 MG PO TABS: 6.2500 mg | ORAL_TABLET | Freq: Two times a day (BID) | ORAL | 3 refills | Status: DC

## 2022-10-15 NOTE — Patient Instructions (Addendum)
Medication Instructions:  STOP Toprol XL START carvedilol (Coreg) 6.25 mg two times daily  Please check your blood pressure at home daily, write it down.  Call the office or send message via Mychart with the readings in 2 weeks for Dr. Gardiner Rhyme to review.   *If you need a refill on your cardiac medications before your next appointment, please call your pharmacy*   Lab Work: BMET, Mag, Lipid today  If you have labs (blood work) drawn today and your tests are completely normal, you will receive your results only by: [place] (if you have MyChart) OR A paper copy in the mail If you have any lab test that is abnormal or we need to change your treatment, we will call you to review the results.   Testing/Procedures: Your physician has requested that you have an echocardiogram (at [facility]). Echocardiography is a painless test that uses sound waves to create images of your heart. It provides your doctor with information about the size and shape of your heart and how well your heart's chambers and valves are working. This procedure takes approximately one hour. There are no restrictions for this procedure. Please do NOT wear cologne, perfume, aftershave, or lotions (deodorant is allowed). Please arrive 15 minutes prior to your appointment time.  Coronary CTA-see instructions below   Follow-Up: At [facility], you and your health needs are our priority.  As part of our continuing mission to provide you with exceptional heart care, we have created designated Provider Care Teams.  These Care Teams include your primary Cardiologist (physician) and Advanced Practice Providers (APPs -  Physician Assistants and Nurse Practitioners) who all work together to provide you with the care you need, when you need it.  We recommend signing up for the patient portal called "MyChart".  Sign up information is provided on this After Visit Summary.  MyChart is used to connect with patients for  Virtual Visits (Telemedicine).  Patients are able to view lab/test results, encounter notes, upcoming appointments, etc.  Non-urgent messages can be sent to your provider as well.   To learn more about what you can do with MyChart, go to [email or web address].    Your next appointment:   3 month(s)  The format for your next appointment:   In Person  Provider:   Donato Heinz, MD     You have been referred to : Amy-Careguide to help with smoking cessation Other Instructions   Your cardiac CT will be scheduled at one of the below locations:   [facility] [address] [place], [place] [address] [phone]  If scheduled at [facility], please arrive at the [facility] and [facility] Entrance (Entrance C2) of [facility] 30 minutes prior to test start time. You can use the FREE valet parking offered at entrance C (encouraged to control the heart rate for the test)  Proceed to the [facility] Radiology Department (first floor) to check-in and test prep.  All radiology patients and guests should use entrance C2 at [facility], accessed from [facility], even though the hospital's physical address listed is [address]       Please follow these instructions carefully (unless otherwise directed):   On the Night Before the Test: Be sure to Drink plenty of water. Do not consume any caffeinated/decaffeinated beverages or chocolate 12 hours prior to your test. Do not take any antihistamines 12 hours prior to your test.   On the Day  of the Test: Drink plenty of water until 1 hour prior to the test. Do not eat any food 1 hour prior to test. You may take your regular medications prior to the test.  HOLD AM dose of carvedilol (Coreg) Take metoprolol 50 mg (Lopressor) two hours prior to test. HOLD Hydrochlorothiazide morning of the test. FEMALES- please wear underwire-free bra if available, avoid dresses & tight  clothing      After the Test: Drink plenty of water. After receiving IV contrast, you may experience a mild flushed feeling. This is normal. On occasion, you may experience a mild rash up to 24 hours after the test. This is not dangerous. If this occurs, you can take Benadryl 25 mg and increase your fluid intake. If you experience trouble breathing, this can be serious. If it is severe call 911 IMMEDIATELY. If it is mild, please call our office. If you take any of these medications: Glipizide/Metformin, Avandament, Glucavance, please do not take 48 hours after completing test unless otherwise instructed.  We will call to schedule your test 2-4 weeks out understanding that some insurance companies will need an authorization prior to the service being performed.   For non-scheduling related questions, please contact the cardiac imaging nurse navigator should you have any questions/concerns: Marchia Bond, Cardiac Imaging Nurse Navigator Gordy Clement, Cardiac Imaging Nurse Navigator [facility] Direct Office Dial: [phone]   For scheduling needs, including cancellations and rescheduling, please call [place], [phone].

## 2022-10-15 NOTE — Telephone Encounter (Signed)
Called patient per health coaching referral from Dr. Gardiner Rhyme regarding smoking cessation. Patient did not answer. Left message for patient to return call to discuss health coaching in detail.    Jakeia Carreras [facility] Guide, Health Coach [address] [place] [address] Telephone: [phone] Email: [email or web address]

## 2022-10-18 ENCOUNTER — Telehealth: Payer: Self-pay | Admitting: Cardiology

## 2022-10-18 ENCOUNTER — Other Ambulatory Visit: Payer: Self-pay

## 2022-10-18 DIAGNOSIS — Z79899 Other long term (current) drug therapy: Secondary | ICD-10-CM

## 2022-10-18 MED ORDER — ATORVASTATIN CALCIUM 40 MG PO TABS: 40.0000 mg | ORAL_TABLET | Freq: Every day | ORAL | 0 refills | Status: DC

## 2022-10-18 MED ORDER — AMLODIPINE BESYLATE 5 MG PO TABS: 5.0000 mg | ORAL_TABLET | Freq: Every day | ORAL | 3 refills | Status: DC

## 2022-10-18 MED ORDER — MAGNESIUM OXIDE 400 MG PO CAPS: 400.0000 mg | ORAL_CAPSULE | Freq: Two times a day (BID) | ORAL | 0 refills | Status: AC

## 2022-10-18 NOTE — Telephone Encounter (Signed)
Follow  Up:      Returning call from yesterday, concerning her results.

## 2022-10-18 NOTE — Telephone Encounter (Addendum)
Spoke with patient regarding orders from Dr. Gardiner Rhyme: "Cholesterol elevated, we will follow-up results of coronary CTA to guide how aggressive to be in lowering cholesterol  Magnesium is significantly reduced, likely due to hydrochlorothiazide.  Recommend switching from hydrochlorothiazide to amlodipine 5 mg daily.  Would prescribe magnesium oxide 400 mg twice daily x3 days.  Check BP daily for next 2 weeks and call with results.  Repeat BMET/magnesium in 1 week."  Patient verbalized understanding.  Orders placed. Also spoke with patient's pharmacy so patient can have medication available to her this evening. Ordered 15 tablets of atorvastatin '40mg'$  to carry patient until her next appointment (her insurance won't refill yet). She has not taken any for 3 days. HCTZ removed from patient's med list. Patient stated she can get blood work on 11/27.

## 2022-10-20 ENCOUNTER — Encounter: Payer: Self-pay | Admitting: Cardiology

## 2022-10-21 ENCOUNTER — Ambulatory Visit (HOSPITAL_COMMUNITY)
Admission: RE | Admit: 2022-10-21 | Discharge: 2022-10-21 | Disposition: A | Discharge status: Home / Self Care | Payer: Commercial Managed Care - PPO | Source: Ambulatory Visit | Attending: Cardiology | Admitting: Cardiology

## 2022-10-21 DIAGNOSIS — R011 Cardiac murmur, unspecified: Secondary | ICD-10-CM | POA: Diagnosis present

## 2022-10-21 LAB — ECHOCARDIOGRAM COMPLETE
AR max vel: 2.99 cm2
AV Area VTI: 3.02 cm2
AV Area mean vel: 2.96 cm2
AV Mean grad: 5 mmHg
AV Peak grad: 9.6 mmHg
Ao pk vel: 1.55 m/s
Area-P 1/2: 3.27 cm2
MV VTI: 3.47 cm2
S' Lateral: 2.9 cm

## 2022-10-21 NOTE — Telephone Encounter (Signed)
That is okay if not able to take magnesium.  Suspect her magnesium level will come up with stopping HCTZ.  Repeat BMET/mag in 1 week

## 2022-10-21 NOTE — Progress Notes (Signed)
*  PRELIMINARY RESULTS* Echocardiogram 2D Echocardiogram has been performed.  Tracey Luna 10/21/2022, 3:53 PM

## 2022-10-29 ENCOUNTER — Ambulatory Visit (HOSPITAL_COMMUNITY): Payer: Commercial Managed Care - PPO

## 2022-10-31 ENCOUNTER — Telehealth (HOSPITAL_COMMUNITY): Payer: Self-pay | Admitting: *Deleted

## 2022-10-31 NOTE — Telephone Encounter (Signed)
Attempted to call patient to reschedule her upcoming cardiac imaging appointment. Left message on voicemail with name and callback number  Gordy Clement RN Navigator Cardiac [facility] [phone] Office [phone] Cell

## 2022-11-01 ENCOUNTER — Ambulatory Visit (HOSPITAL_COMMUNITY): Admission: RE | Admit: 2022-11-01 | Payer: Commercial Managed Care - PPO | Source: Ambulatory Visit

## 2022-11-01 ENCOUNTER — Encounter (HOSPITAL_COMMUNITY): Payer: Self-pay

## 2022-11-13 ENCOUNTER — Ambulatory Visit: Payer: Commercial Managed Care - PPO | Admitting: Vascular Surgery

## 2022-11-18 ENCOUNTER — Other Ambulatory Visit: Payer: Self-pay

## 2022-11-18 ENCOUNTER — Encounter (HOSPITAL_BASED_OUTPATIENT_CLINIC_OR_DEPARTMENT_OTHER): Payer: Self-pay

## 2022-11-18 ENCOUNTER — Emergency Department (HOSPITAL_BASED_OUTPATIENT_CLINIC_OR_DEPARTMENT_OTHER): Payer: Commercial Managed Care - PPO

## 2022-11-18 ENCOUNTER — Emergency Department (HOSPITAL_BASED_OUTPATIENT_CLINIC_OR_DEPARTMENT_OTHER)
Admission: EM | Admit: 2022-11-18 | Discharge: 2022-11-18 | Disposition: A | Discharge status: Home / Self Care | Payer: Commercial Managed Care - PPO | Attending: Emergency Medicine | Admitting: Emergency Medicine

## 2022-11-18 DIAGNOSIS — E119 Type 2 diabetes mellitus without complications: Secondary | ICD-10-CM | POA: Diagnosis not present

## 2022-11-18 DIAGNOSIS — Z79899 Other long term (current) drug therapy: Secondary | ICD-10-CM | POA: Diagnosis not present

## 2022-11-18 DIAGNOSIS — J449 Chronic obstructive pulmonary disease, unspecified: Secondary | ICD-10-CM | POA: Diagnosis not present

## 2022-11-18 DIAGNOSIS — N3091 Cystitis, unspecified with hematuria: Secondary | ICD-10-CM | POA: Insufficient documentation

## 2022-11-18 DIAGNOSIS — Z7984 Long term (current) use of oral hypoglycemic drugs: Secondary | ICD-10-CM | POA: Insufficient documentation

## 2022-11-18 DIAGNOSIS — R197 Diarrhea, unspecified: Secondary | ICD-10-CM | POA: Diagnosis not present

## 2022-11-18 DIAGNOSIS — N202 Calculus of kidney with calculus of ureter: Secondary | ICD-10-CM | POA: Insufficient documentation

## 2022-11-18 DIAGNOSIS — E876 Hypokalemia: Secondary | ICD-10-CM | POA: Insufficient documentation

## 2022-11-18 DIAGNOSIS — Z7951 Long term (current) use of inhaled steroids: Secondary | ICD-10-CM | POA: Insufficient documentation

## 2022-11-18 DIAGNOSIS — I10 Essential (primary) hypertension: Secondary | ICD-10-CM | POA: Insufficient documentation

## 2022-11-18 DIAGNOSIS — R109 Unspecified abdominal pain: Secondary | ICD-10-CM | POA: Diagnosis present

## 2022-11-18 DIAGNOSIS — N200 Calculus of kidney: Secondary | ICD-10-CM

## 2022-11-18 DIAGNOSIS — D72829 Elevated white blood cell count, unspecified: Secondary | ICD-10-CM | POA: Insufficient documentation

## 2022-11-18 LAB — URINALYSIS, MICROSCOPIC (REFLEX)

## 2022-11-18 LAB — COMPREHENSIVE METABOLIC PANEL
ALT: 19 U/L (ref 0–44)
AST: 16 U/L (ref 15–41)
Albumin: 3.5 g/dL (ref 3.5–5.0)
Alkaline Phosphatase: 79 U/L (ref 38–126)
Anion gap: 9 (ref 5–15)
BUN: 16 mg/dL (ref 8–23)
CO2: 25 mmol/L (ref 22–32)
Calcium: 9.1 mg/dL (ref 8.9–10.3)
Chloride: 104 mmol/L (ref 98–111)
Creatinine, Ser: 0.94 mg/dL (ref 0.44–1.00)
GFR, Estimated: >60 mL/min (ref >60)
Glucose, Bld: 201 mg/dL — ABNORMAL HIGH (ref 70–99)
Potassium: 3 mmol/L — ABNORMAL LOW (ref 3.5–5.1)
Sodium: 138 mmol/L (ref 135–145)
Total Bilirubin: 1.2 mg/dL (ref 0.3–1.2)
Total Protein: 7 g/dL (ref 6.5–8.1)

## 2022-11-18 LAB — CBC
HCT: 37.8 % (ref 36.0–46.0)
Hemoglobin: 12.8 g/dL (ref 12.0–15.0)
MCH: 31.5 pg (ref 26.0–34.0)
MCHC: 33.9 g/dL (ref 30.0–36.0)
MCV: 93.1 fL (ref 80.0–100.0)
Platelets: 254 10*3/uL (ref 150–400)
RBC: 4.06 MIL/uL (ref 3.87–5.11)
RDW: 12.5 % (ref 11.5–15.5)
WBC: 8.8 10*3/uL (ref 4.0–10.5)
nRBC: 0 % (ref 0.0–0.2)

## 2022-11-18 LAB — URINALYSIS, ROUTINE W REFLEX MICROSCOPIC
Glucose, UA: NEGATIVE mg/dL
Ketones, ur: 15 mg/dL — AB
Nitrite: NEGATIVE
Protein, ur: 100 mg/dL — AB
Specific Gravity, Urine: 1.02 (ref 1.005–1.030)
pH: 6 (ref 5.0–8.0)

## 2022-11-18 LAB — LIPASE, BLOOD: Lipase: 30 U/L (ref 11–51)

## 2022-11-18 MED ORDER — CEFDINIR 300 MG PO CAPS: 300.0000 mg | ORAL_CAPSULE | Freq: Two times a day (BID) | ORAL | 0 refills | Status: DC

## 2022-11-18 MED ORDER — SODIUM CHLORIDE 0.9 % IV BOLUS
1000.0000 mL | Freq: Once | INTRAVENOUS | Status: AC
  Administered 2022-11-18: 1000 mL via INTRAVENOUS

## 2022-11-18 MED ORDER — IOHEXOL 300 MG/ML  SOLN
100.0000 mL | Freq: Once | INTRAMUSCULAR | Status: AC | PRN
  Administered 2022-11-18: 100 mL via INTRAVENOUS

## 2022-11-18 MED ORDER — TAMSULOSIN HCL 0.4 MG PO CAPS: 0.4000 mg | ORAL_CAPSULE | Freq: Every day | ORAL | 0 refills | Status: DC

## 2022-11-18 MED ORDER — POTASSIUM CHLORIDE CRYS ER 20 MEQ PO TBCR
60.0000 meq | EXTENDED_RELEASE_TABLET | Freq: Once | ORAL | Status: AC
  Administered 2022-11-18: 60 meq via ORAL
  Filled 2022-11-18: qty 3

## 2022-11-18 MED ORDER — SODIUM CHLORIDE 0.9 % IV SOLN
1.0000 g | Freq: Once | INTRAVENOUS | Status: AC
  Administered 2022-11-18: 1 g via INTRAVENOUS
  Filled 2022-11-18: qty 10

## 2022-11-18 MED ORDER — ONDANSETRON HCL 4 MG PO TABS: 4.0000 mg | ORAL_TABLET | Freq: Four times a day (QID) | ORAL | 0 refills | Status: DC

## 2022-11-18 MED ORDER — ONDANSETRON HCL 4 MG/2ML IJ SOLN
4.0000 mg | Freq: Once | INTRAMUSCULAR | Status: AC
  Administered 2022-11-18: 4 mg via INTRAVENOUS
  Filled 2022-11-18: qty 2

## 2022-11-18 MED ORDER — KETOROLAC TROMETHAMINE 15 MG/ML IJ SOLN
15.0000 mg | Freq: Once | INTRAMUSCULAR | Status: AC
  Administered 2022-11-18: 15 mg via INTRAVENOUS
  Filled 2022-11-18: qty 1

## 2022-11-18 NOTE — Discharge Instructions (Addendum)
You were seen in the emergency department and found to have a kidney stone.  Please take ibuprofen as needed for pain.  We are sending you home with multiple medications to assist with passing the stone and for residual pain/nausea:  -Flomax-this is a medication to help pass the stone, it allows urine to exit the body more freely.  Please take this once daily with a meal.  -Zofran-this is an antinausea medication, you may take this every 8 hours as needed for nausea and vomiting, please allow the tablet to dissolve underneath of your tongue.   We have prescribed you new medication(s) today. Discuss the medications prescribed today with your pharmacist as they can have adverse effects and interactions with your other medicines including over the counter and prescribed medications. Seek medical evaluation if you start to experience new or abnormal symptoms after taking one of these medicines, seek care immediately if you start to experience difficulty breathing, feeling of your throat closing, facial swelling, or rash as these could be indications of a more serious allergic reaction  Please follow-up with the urology group provided in your discharge instructions within 3 to 5 days.  Return to the ER for new or worsening symptoms including but not limited to worsening pain not controlled by these medicines, inability to keep fluids down, fever, or any other concerns that you may have.

## 2022-11-18 NOTE — ED Triage Notes (Addendum)
States was at [facility] yesterday, negative for covid/flu. Had diarrhea for 4 days starting 12/7, had low BP & dizziness. Started having fever/chills on 12/15, continues to have abdominal pain w/ loose stools. States had a UTI and given abx.  Adds she has chest tightness since Saturday, hx of COPD.

## 2022-11-18 NOTE — ED Provider Notes (Signed)
[facility]PARTMENT Provider Note   CSN: [id number] Arrival date & time: 11/18/22  1647     History  Chief Complaint  Patient presents with   Abdominal Pain    Tracey Luna is a 61 y.o. female This an overall well-appearing 61 year old female who presents with concern for abdominal pain, dysuria, difficulty urinating, loose stools, she reports that she has been having some diarrhea, low blood pressure, dehydration, and abdominal pain for several days to weeks.  She was seen evaluated urgent care yesterday and encouraged to come to the emergency department for further evaluation but did not want to wait and so is here today with same.  She denies significant abdominal pain at rest but reports significant abdominal pain with palpation.   Abdominal Pain      Home Medications Prior to Admission medications   Medication Sig Start Date End Date Taking? Authorizing Provider  cefdinir (OMNICEF) 300 MG capsule Take 1 capsule (300 mg total) by mouth 2 (two) times daily. 11/18/22  Yes Karleen Seebeck H, PA-C  ondansetron (ZOFRAN) 4 MG tablet Take 1 tablet (4 mg total) by mouth every 6 (six) hours. 11/18/22  Yes Griselda Tosh H, PA-C  tamsulosin (FLOMAX) 0.4 MG CAPS capsule Take 1 capsule (0.4 mg total) by mouth daily. 11/18/22  Yes Nur Rabold H, PA-C  albuterol (VENTOLIN HFA) 108 (90 Base) MCG/ACT inhaler Inhale into the lungs as needed. 05/31/20   [provider]  amLODipine (NORVASC) 5 MG tablet Take 1 tablet (5 mg total) by mouth daily. 10/18/22   Donato Heinz, MD  atorvastatin (LIPITOR) 40 MG tablet Take 1 tablet (40 mg total) by mouth daily. 10/18/22   Donato Heinz, MD  carvedilol (COREG) 6.25 MG tablet Take 1 tablet (6.25 mg total) by mouth 2 (two) times daily. 10/15/22   Donato Heinz, MD  Fluticasone-Umeclidin-Vilant (TRELEGY ELLIPTA) 100-62.5-25 MCG/INH AEPB Inhale 1 puff into the lungs daily. 01/05/19    Icard, Octavio Graves, DO  LORazepam (ATIVAN) 0.5 MG tablet Take 0.5 mg by mouth as needed.    [provider]  losartan (COZAAR) 100 MG tablet Take 1 tablet (100 mg total) by mouth daily. 04/04/22   Donato Heinz, MD  metFORMIN (GLUCOPHAGE-XR) 500 MG 24 hr tablet Take one pill with your largest meal 08/31/19   [provider]  metoprolol tartrate (LOPRESSOR) 50 MG tablet AM of CT scan: Take 50 mg (1 tablet) TWO hours prior to CT scan (HOLD AM dose of Coreg) 10/15/22   Donato Heinz, MD  NON [facility]  Anti-Inflammatory Cream- Diclofenac 3%, Baclofen 2%, Lidocaine 2% Apply 1-2 grams to affected area 3-4 times daily Qty. 120 gm 3 refills    [provider]  nystatin cream (MYCOSTATIN) nystatin 100,000 unit/gram topical cream  APP EXT AA BID    [provider]  omeprazole (PRILOSEC) 40 MG capsule Take 40 mg by mouth daily.    [provider]      Allergies    Avelox [moxifloxacin hcl in nacl], Penicillins, and Vancomycin    Review of Systems   Review of Systems  Gastrointestinal:  Positive for abdominal pain.  All other systems reviewed and are negative.   Physical Exam Updated Vital Signs BP (!) 131/91 (BP Location: Right Arm)   Pulse 91   Temp 98.4 F (36.9 C) (Oral)   Resp 18   Ht '5\' 4"'$  (1.626 m)   Wt 85.3 kg   SpO2 97%  BMI 32.27 kg/m  Physical Exam Vitals and nursing note reviewed.  Constitutional:      General: She is not in acute distress.    Appearance: Normal appearance. She is not ill-appearing.  HENT:     Head: Normocephalic and atraumatic.  Eyes:     General:        Right eye: No discharge.        Left eye: No discharge.  Cardiovascular:     Rate and Rhythm: Normal rate and regular rhythm.     Heart sounds: No murmur heard.    No friction rub. No gallop.  Pulmonary:     Effort: Pulmonary effort is normal.     Breath sounds: Normal breath sounds.  Abdominal:     General:  Bowel sounds are normal.     Palpations: Abdomen is soft.     Comments: Generally tender throughout, more focally on the left side, no rebound, rigidity, guarding throughout  Skin:    General: Skin is warm and dry.     Capillary Refill: Capillary refill takes less than 2 seconds.  Neurological:     Mental Status: She is alert and oriented to person, place, and time.  Psychiatric:        Mood and Affect: Mood normal.        Behavior: Behavior normal.     ED Results / Procedures / Treatments   Labs (all labs ordered are listed, but only abnormal results are displayed) Labs Reviewed  COMPREHENSIVE METABOLIC PANEL - Abnormal; Notable for the following components:      Result Value   Potassium 3.0 (*)    Glucose, Bld 201 (*)    All other components within normal limits  URINALYSIS, ROUTINE W REFLEX MICROSCOPIC - Abnormal; Notable for the following components:   APPearance CLOUDY (*)    Hgb urine dipstick MODERATE (*)    Bilirubin Urine SMALL (*)    Ketones, ur 15 (*)    Protein, ur 100 (*)    Leukocytes,Ua TRACE (*)    All other components within normal limits  URINALYSIS, MICROSCOPIC (REFLEX) - Abnormal; Notable for the following components:   Bacteria, UA MANY (*)    All other components within normal limits  URINE CULTURE  LIPASE, BLOOD  CBC    EKG None  Radiology CT ABDOMEN PELVIS W CONTRAST  Result Date: 11/18/2022 CLINICAL DATA:  Acute abdominal pain and diarrhea. EXAM: CT ABDOMEN AND PELVIS WITH CONTRAST TECHNIQUE: Multidetector CT imaging of the abdomen and pelvis was performed using the standard protocol following bolus administration of intravenous contrast. RADIATION DOSE REDUCTION: This exam was performed according to the departmental dose-optimization program which includes automated exposure control, adjustment of the mA and/or kV according to patient size and/or use of iterative reconstruction technique. CONTRAST:  130m OMNIPAQUE IOHEXOL 300 MG/ML  SOLN  COMPARISON:  CT abdomen and pelvis 05/04/2009 FINDINGS: Lower chest: There is some minimal patchy opacity in the lingula. There is a trace right pleural effusion. Hepatobiliary: No focal liver abnormality is seen. Status post cholecystectomy. No biliary dilatation. Pancreas: Unremarkable. No pancreatic ductal dilatation or surrounding inflammatory changes. Spleen: Normal in size without focal abnormality. Adrenals/Urinary Tract: 2.5 cm cyst in the right kidney. There is a 1 mm calculus in the proximal left ureter image 2/32. There is no hydronephrosis. Otherwise, the kidneys appear within normal limits. Adrenal glands and bladder are within normal limits. Stomach/Bowel: Stomach is within normal limits. Appendix appears normal. No evidence of bowel wall thickening, distention,  or inflammatory changes. Vascular/Lymphatic: Aortic atherosclerosis. No enlarged abdominal or pelvic lymph nodes. Reproductive: Posterior uterine fibroid likely present measuring 3 cm. Adnexa are within normal limits. Other: No abdominal wall hernia or abnormality. No abdominopelvic ascites. Musculoskeletal: No acute osseous finding. There is sclerosis of the bilateral sacroiliac joints, unchanged. IMPRESSION: 1. 1 mm calculus in the proximal left ureter. No hydronephrosis. 2. Trace right pleural effusion. 3. Minimal patchy opacity in the lingula may represent atelectasis or infection. 4. Uterine fibroid. Aortic Atherosclerosis (ICD10-I70.0). Electronically Signed   By: Ronney Asters M.D.   On: 11/18/2022 22:56    Procedures Procedures    Medications Ordered in ED Medications  cefTRIAXone (ROCEPHIN) 1 g in sodium chloride 0.9 % 100 mL IVPB (0 g Intravenous Stopped 11/18/22 2311)  sodium chloride 0.9 % bolus 1,000 mL (1,000 mLs Intravenous New Bag/Given 11/18/22 2211)  potassium chloride SA (KLOR-CON M) CR tablet 60 mEq (60 mEq Oral Given 11/18/22 2203)  ondansetron (ZOFRAN) injection 4 mg (4 mg Intravenous Given 11/18/22 2216)   iohexol (OMNIPAQUE) 300 MG/ML solution 100 mL (100 mLs Intravenous Contrast Given 11/18/22 2235)  ketorolac (TORADOL) 15 MG/ML injection 15 mg (15 mg Intravenous Given 11/18/22 2321)    ED Course/ Medical Decision Making/ A&P Clinical Course as of 11/18/22 2327  Mon Nov 18, 2022  2308 Stable, multiple complaints.  Has a 1 mm stone without obstruction.  Does have bacteriuria but has been on antibiotics.  Not septic appearing.  Will plan for follow-up with urology broad antibiosis to ensure no progression of disease.  Do not believe that this is obstructive infected nephrolithiasis at this time but patient will need strict return precautions and expectant management. [CC]    Clinical Course User Index [CC] Tretha Sciara, MD                           Medical Decision Making Amount and/or Complexity of Data Reviewed Labs: ordered. Radiology: ordered.  Risk Prescription drug management.   This patient is a 61 y.o. female who presents to the ED for concern of abdominal pain, dysuria, difficulty urinating, with intermittent nausea, without significant fever, this involves an extensive number of treatment options, and is a complaint that carries with it a high risk of complications and morbidity. The emergent differential diagnosis prior to evaluation includes, but is not limited to,  The causes of generalized abdominal pain include but are not limited to AAA, mesenteric ischemia, appendicitis, diverticulitis, DKA, gastritis, gastroenteritis, AMI, nephrolithiasis, pancreatitis, peritonitis, adrenal insufficiency,lead poisoning, iron toxicity, intestinal ischemia, constipation, UTI,SBO/LBO, splenic rupture, biliary disease, IBD, IBS, PUD, or hepatitis.  This is not an exhaustive differential.   Past Medical History / Co-morbidities / Social History: High blood pressure, high cholesterol, acid reflux, diabetes 1.5, ascending aortic dilatation, COPD  Additional history: Chart reviewed.  Pertinent results include: Reviewed recent outpatient urgent care visit where patient was urged to come to the emergency department, she had negative testing for COVID, flu at that time  Physical Exam: Physical exam performed. The pertinent findings include: Blood pressure generally tender throughout abdomen, more focally on the left side, overall stable vital signs, developed mild hypotension on reevaluation with blood pressure 131/91.  Lab Tests: I ordered, and personally interpreted labs.  The pertinent results include: Patient with unremarkable CBC, lipase, CMP notable for hypokalemia, testing 3.0, we will orally replete.  UA with moderate hemoglobin, ketones, leukocytosis, many bacteria, 6-10 white blood cells, and white blood cell casts  Imaging Studies: I ordered imaging studies including CT abdomen pelvis with contrast. I independently visualized and interpreted imaging which showed 1 mm stone in proximal left ureter.  Uterine fibroid noted, some atelectasis versus early infection, based on patient presentation most suspicious for atelectasis infection, based on patient presentation most suspicious for atelectasis, trace right pleural effusion also noted. I agree with the radiologist interpretation.   Cardiac Monitoring:  The patient was maintained on a cardiac monitor.  My attending physician Dr. Oswald Hillock viewed and interpreted the cardiac monitored which showed an underlying rhythm of: NSR. I agree with this interpretation.   Medications: I ordered medication including Rocephin, Zofran, potassium chloride, Toradol, fluid bolus for dehydration, infection, hypokalemia. Reevaluation of the patient after these medicines showed that the patient improved. I have reviewed the patients home medicines and have made adjustments as needed.   Disposition: After consideration of the diagnostic results and the patients response to treatment, I feel that patient's symptoms consistent with  inadequately treated UTI, kidney stone, no other significant abnormalities noted, given small 1 mm stone, evidence of ongoing bacterial infection we will treat with broad antibiosis, and encourage close urologic follow-up, will discharge with Zofran, Omnicef, and Flomax.   emergency department workup does not suggest an emergent condition requiring admission or immediate intervention beyond what has been performed at this time. The plan is: as above. The patient is safe for discharge and has been instructed to return immediately for worsening symptoms, change in symptoms or any other concerns.  I discussed this case with my attending physician Dr. Oswald Hillock who cosigned this note including patient's presenting symptoms, physical exam, and planned diagnostics and interventions. Attending physician stated agreement with plan or made changes to plan which were implemented.    Final Clinical Impression(s) / ED Diagnoses Final diagnoses:  Nephrolithiasis  Cystitis with hematuria  Hypokalemia    Rx / DC Orders ED Discharge Orders          Ordered    cefdinir (OMNICEF) 300 MG capsule  2 times daily        11/18/22 2320    ondansetron (ZOFRAN) 4 MG tablet  Every 6 hours        11/18/22 2320    tamsulosin (FLOMAX) 0.4 MG CAPS capsule  Daily        11/18/22 2320              Anselmo Pickler, PA-C 11/18/22 2327    Tretha Sciara, MD 11/18/22 2335

## 2022-11-20 LAB — URINE CULTURE

## 2022-12-26 ENCOUNTER — Encounter (HOSPITAL_COMMUNITY): Payer: Self-pay

## 2023-01-02 ENCOUNTER — Other Ambulatory Visit: Payer: Self-pay | Admitting: *Deleted

## 2023-01-02 DIAGNOSIS — Z01812 Encounter for preprocedural laboratory examination: Secondary | ICD-10-CM

## 2023-01-02 DIAGNOSIS — R079 Chest pain, unspecified: Secondary | ICD-10-CM

## 2023-01-08 ENCOUNTER — Telehealth (HOSPITAL_COMMUNITY): Payer: Self-pay | Admitting: Emergency Medicine

## 2023-01-08 NOTE — Telephone Encounter (Signed)
Reaching out to patient to offer assistance regarding upcoming cardiac imaging study; pt verbalizes understanding of appt date/time, parking situation and where to check in, pre-test NPO status and medications ordered, and verified current allergies; name and call back number provided for further questions should they arise Tracey Bond RN Navigator Cardiac Imaging [facility] [phone] office [phone] cell  Arrival 1100 WC entrance '50mg'$  metoprolol tartate  Hold carvedilol  Denies iv issues Aware contrast/nitro

## 2023-01-08 NOTE — Telephone Encounter (Signed)
Attempted to call patient regarding upcoming cardiac CT appointment. °Left message on voicemail with name and callback number °Neilah Fulwider RN Navigator Cardiac Imaging °[facility] °[phone] Office °[phone] Cell ° °

## 2023-01-09 ENCOUNTER — Ambulatory Visit (HOSPITAL_COMMUNITY)
Admission: RE | Admit: 2023-01-09 | Discharge: 2023-01-09 | Disposition: A | Discharge status: Home / Self Care | Payer: Commercial Managed Care - PPO | Source: Ambulatory Visit | Attending: Cardiology | Admitting: Cardiology

## 2023-01-09 DIAGNOSIS — R079 Chest pain, unspecified: Secondary | ICD-10-CM | POA: Insufficient documentation

## 2023-01-09 MED ORDER — NITROGLYCERIN 0.4 MG SL SUBL
0.8000 mg | SUBLINGUAL_TABLET | Freq: Once | SUBLINGUAL | Status: AC
  Administered 2023-01-09: 0.8 mg via SUBLINGUAL

## 2023-01-09 MED ORDER — NITROGLYCERIN 0.4 MG SL SUBL
SUBLINGUAL_TABLET | SUBLINGUAL | Status: AC
  Filled 2023-01-09: qty 2

## 2023-01-09 MED ORDER — IOHEXOL 350 MG/ML SOLN
95.0000 mL | Freq: Once | INTRAVENOUS | Status: AC | PRN
  Administered 2023-01-09: 95 mL via INTRAVENOUS

## 2023-01-10 ENCOUNTER — Other Ambulatory Visit: Payer: Self-pay | Admitting: *Deleted

## 2023-01-10 MED ORDER — ASPIRIN 81 MG PO TBEC: 81.0000 mg | DELAYED_RELEASE_TABLET | Freq: Every day | ORAL | 3 refills | Status: AC

## 2023-01-10 MED ORDER — ATORVASTATIN CALCIUM 80 MG PO TABS: 80.0000 mg | ORAL_TABLET | Freq: Every day | ORAL | 3 refills | Status: DC

## 2023-01-26 NOTE — Progress Notes (Unsigned)
Cardiology Office Note:    Date:  01/26/2023   ID:  Tracey Luna, DOB 11-08-1961, MRN [id number]  PCP:  Aletha Halim., PA-C  Cardiologist:  Donato Heinz, MD  Electrophysiologist:  None   Referring MD: Aletha Halim., PA-C   No chief complaint on file.   History of Present Illness:    Tracey Luna is a 62 y.o. female with a hx of TIA, hypertension, hyperlipidemia, mitral valve prolapse, aortic dilatation, tobacco use, COPD who presents for follow-up.  She was referred by Bing Matter, PA for evaluation of aortic dilatation, initially seen 07/05/2021.  She previously followed with Dr. Einar Gip in cardiology, last seen 05/04/20.  Echocardiogram on 06/16/2019 showed normal LV systolic function, mild LVH, grade 1 diastolic dysfunction, mild MR.  Lexiscan Myoview on 06/28/2019 showed normal perfusion.  CT chest without contrast 12/04/2018 showed ascending aortic dilatation measuring 41 mm.  MRA chest 02/23/2020 showed stable ascending aortic dilatation measuring 41 mm.  She reports intermittent chest pain that she describes as sharp pain that lasts for few seconds and resolves.  Reports occasional shortness of breath.  She had COVID-19 infection in January 2022.  Had COPD exacerbation after this.  Reports occasional lightheadedness with standing, denies any syncope.  Reports intermittent lower extremity edema.  Reports having palpitations occur about once per week, can last for hours.  Smokes 0.5 to 0.75 ppd, has smoked for over 40 years.  Family history includes both her sister and mother have 43 and her sister had her thoracic aortic aneurysm repaired.  Her father has had multiple MIs, first in his 107s, and had a pacemaker.  Zio patch x12 days on 08/03/2021 showed 2 episodes of SVT, longest lasting 2 hours 13 minutes with average rate 118 bpm.  Started on Toprol-XL 25 mg daily.  Echocardiogram 10/21/2022 showed normal biventricular function, no significant valvular disease.   Coronary CTA on 01/09/2023 showed nonobstructive CAD, calcium score 396 (96 percentile), dilated ascending aorta measuring 42 mm.  Since last clinic visit,  ***Mag  she reports she has been having chest pain.  Describes as a left-sided chest pain, sometimes feels sharp but sometimes as a heaviness.  Occurs 1-2 times per month.  Has not noted clear relationship with exertion but reports she does not exercise.  Does report she gets out of breath with exertion.  Chest pain can last up to few hours.  Does report having some lightheadedness but denies any syncope.  Reports continues to have palpitations but improved with starting metoprolol.  She continues to smoke 0.75 packs/day.     Past Medical History:  Diagnosis Date   Anxiety    Complication of anesthesia    COPD (chronic obstructive pulmonary disease) (HCC)    GERD (gastroesophageal reflux disease)    GERD (gastroesophageal reflux disease) 09/24/2016   Hyperglycemia    Hyperlipidemia    Hypertension    PONV (postoperative nausea and vomiting)    Pre-diabetes    Stroke (HCC)    TIA, no deficits   Varicose veins    Vitamin D deficiency     Past Surgical History:  Procedure Laterality Date   CHOLECYSTECTOMY     EXCISION ORAL TUMOR Right 05/20/2017   Procedure: EXCISION RIGHT BUCCAL MUCOSAL LESION;  Surgeon: Leta Baptist, MD;  Location: [facility];  Service: ENT;  Laterality: Right;    Current Medications: No outpatient medications have been marked as taking for the 01/27/23 encounter (Appointment) with Donato Heinz, MD.  Allergies:   Avelox [moxifloxacin hcl in nacl], Penicillins, and Vancomycin   Social History   Socioeconomic History   Marital status: Married    Spouse name: Not on file   Number of children: 3   Years of education: Not on file   Highest education level: Not on file  Occupational History   Not on file  Tobacco Use   Smoking status: Every Day    Packs/day: 0.50    Years: 30.00     Total pack years: 15.00    Types: Cigarettes   Smokeless tobacco: Never   Tobacco comments:    1/2 pack per day   Vaping Use   Vaping Use: Never used  Substance and Sexual Activity   Alcohol use: Yes    Alcohol/week: 0.0 standard drinks of alcohol    Comment: occ   Drug use: No   Sexual activity: Not on file  Other Topics Concern   Not on file  Social History Narrative   Not on file   Social Determinants of Health   Financial Resource Strain: Not on file  Food Insecurity: Not on file  Transportation Needs: Not on file  Physical Activity: Not on file  Stress: Not on file  Social Connections: Not on file     Family History: The patient's family history includes Bleeding Disorder in her mother; Diabetes in her brother; Heart attack in her father; Heart disease in her father, mother, and sister; Hyperlipidemia in her father; Hypertension in her brother, father, and sister; Varicose Veins in her mother.  ROS:   Please see the history of present illness.     All other systems reviewed and are negative.  EKGs/Labs/Other Studies Reviewed:    The following studies were reviewed today:   EKG:   10/15/22: normal sinus rhythm, rate 69, no ST/T abnormality  Recent Labs: 10/15/2022: Magnesium 1.2 11/18/2022: ALT 19; BUN 16; Creatinine, Ser 0.94; Hemoglobin 12.8; Platelets 254; Potassium 3.0; Sodium 138  Recent Lipid Panel    Component Value Date/Time   CHOL 178 10/15/2022 1043   TRIG 158 (H) 10/15/2022 1043   HDL 37 (L) 10/15/2022 1043   CHOLHDL 4.8 (H) 10/15/2022 1043   LDLCALC 113 (H) 10/15/2022 1043    Physical Exam:    VS:  There were no vitals taken for this visit.    Wt Readings from Last 3 Encounters:  11/18/22 188 lb (85.3 kg)  10/15/22 202 lb 9.6 oz (91.9 kg)  08/09/22 200 lb (90.7 kg)     GEN:  Well nourished, well developed in no acute distress HEENT: Normal NECK: No JVD; No carotid bruits CARDIAC: RRR, 2/6 systolic murmur RESPIRATORY:  Clear to  auscultation without rales, wheezing or rhonchi  ABDOMEN: Soft, non-tender, non-distended MUSCULOSKELETAL:  No edema; No deformity  SKIN: Warm and dry NEUROLOGIC:  Alert and oriented x 3 PSYCHIATRIC:  Normal affect   ASSESSMENT:    No diagnosis found.   PLAN:     CAD: Reported atypical chest pain.  Coronary CTA on 01/09/2023 showed nonobstructive CAD, calcium score 396 (96 percentile), dilated ascending aorta measuring 42 mm.  Echocardiogram 10/21/2022 showed normal biventricular function, no significant valvular disease.  -Continue aspirin 81 mg daily -Continue atorvastatin 80 mg daily  Aortic dilatation: Measured 41 mm on MRA chest 02/23/2020.  She reports she is not able to tolerate MRIs due to claustrophobia.  CTA chest 10/08/2022 showed stable 41 mm ascending aorta.  Coronary CTA 01/2023 showed 42 mm ascending aorta  Hypertension: On  losartan 100 mg daily, carvedilol 6.25 mg twice daily, amlodipine 5 mg daily.  Previously on HCTZ but discontinued due to hypomagnesemia  Hyperlipidemia: LDL 113 on 10/15/2022, on atorvastatin 40 mg daily.  Atorvastatin increased to 80 mg daily 01/2023 given coronary CTA results as above  Tobacco use: Patient counseled on the risk of tobacco use and cessation strongly encouraged.  We will ask our care guide to work with her on smoking cessation  SVT: Zio patch x12 days on 08/03/2021 showed 2 episodes of SVT, longest lasting 2 hours 13 minutes with average rate 118 bpm.   -Continue carvedilol  Daytime somnolence/snoring: Recommend sleep study but she wishes to hold off at this time.  T2DM: On metformin.  A1c 7.6 on 02/06/2021  RTC in 3 months    Medication Adjustments/Labs and Tests Ordered: Current medicines are reviewed at length with the patient today.  Concerns regarding medicines are outlined above.  No orders of the defined types were placed in this encounter.   No orders of the defined types were placed in this encounter.    There are no  Patient Instructions on file for this visit.   Signed, Donato Heinz, MD  01/26/2023 2:09 PM    [facility]

## 2023-01-27 ENCOUNTER — Ambulatory Visit: Payer: Commercial Managed Care - PPO | Attending: Cardiology | Admitting: Cardiology

## 2023-01-27 ENCOUNTER — Encounter: Payer: Self-pay | Admitting: Cardiology

## 2023-01-27 VITALS — BP 138/92 | HR 75 | Ht 64.0 in | Wt 196.2 lb

## 2023-01-27 DIAGNOSIS — R0683 Snoring: Secondary | ICD-10-CM

## 2023-01-27 DIAGNOSIS — R400 Somnolence: Secondary | ICD-10-CM

## 2023-01-27 DIAGNOSIS — I2510 Atherosclerotic heart disease of native coronary artery without angina pectoris: Secondary | ICD-10-CM

## 2023-01-27 DIAGNOSIS — E785 Hyperlipidemia, unspecified: Secondary | ICD-10-CM

## 2023-01-27 DIAGNOSIS — I10 Essential (primary) hypertension: Secondary | ICD-10-CM | POA: Diagnosis not present

## 2023-01-27 DIAGNOSIS — I471 Supraventricular tachycardia, unspecified: Secondary | ICD-10-CM

## 2023-01-27 MED ORDER — NICOTINE 7 MG/24HR TD PT24: 7.0000 mg | MEDICATED_PATCH | Freq: Every day | TRANSDERMAL | 0 refills | Status: AC

## 2023-01-27 MED ORDER — NICOTINE 14 MG/24HR TD PT24: 14.0000 mg | MEDICATED_PATCH | Freq: Every day | TRANSDERMAL | 0 refills | Status: AC

## 2023-01-27 NOTE — Patient Instructions (Signed)
Medication Instructions:  Take carvedilol (Coreg) twice daily  Start nicotine patch --14 mg daily x 4 weeks, then decrease to 7 mg x 4 weeks, then stop.  *If you need a refill on your cardiac medications before your next appointment, please call your pharmacy*   Lab Work: BMET, Mag today  Please return for FASTING labs in 2 months (Lipid)  If you have labs (blood work) drawn today and your tests are completely normal, you will receive your results only by: [place] (if you have MyChart) OR A paper copy in the mail If you have any lab test that is abnormal or we need to change your treatment, we will call you to review the results.  Follow-Up: At [facility], you and your health needs are our priority.  As part of our continuing mission to provide you with exceptional heart care, we have created designated Provider Care Teams.  These Care Teams include your primary Cardiologist (physician) and Advanced Practice Providers (APPs -  Physician Assistants and Nurse Practitioners) who all work together to provide you with the care you need, when you need it.  We recommend signing up for the patient portal called "MyChart".  Sign up information is provided on this After Visit Summary.  MyChart is used to connect with patients for Virtual Visits (Telemedicine).  Patients are able to view lab/test results, encounter notes, upcoming appointments, etc.  Non-urgent messages can be sent to your provider as well.   To learn more about what you can do with MyChart, go to [email or web address].    Your next appointment:   6 month(s)  Provider:   Donato Heinz, MD      Please check your blood pressure at home twice daily, write it down.  Call the office or send message via Mychart with the readings in 1 week for Dr. Gardiner Rhyme to review.

## 2023-01-28 LAB — BASIC METABOLIC PANEL
BUN/Creatinine Ratio: 22 (ref 12–28)
BUN: 16 mg/dL (ref 8–27)
CO2: 19 mmol/L — ABNORMAL LOW (ref 20–29)
Calcium: 9.8 mg/dL (ref 8.7–10.3)
Chloride: 102 mmol/L (ref 96–106)
Creatinine, Ser: 0.73 mg/dL (ref 0.57–1.00)
Glucose: 158 mg/dL — ABNORMAL HIGH (ref 70–99)
Potassium: 4.2 mmol/L (ref 3.5–5.2)
Sodium: 141 mmol/L (ref 134–144)
eGFR: 92 mL/min/{1.73_m2} (ref >59)

## 2023-01-28 LAB — MAGNESIUM: Magnesium: 1.4 mg/dL — ABNORMAL LOW (ref 1.6–2.3)

## 2023-01-29 ENCOUNTER — Other Ambulatory Visit: Payer: Self-pay | Admitting: *Deleted

## 2023-01-29 DIAGNOSIS — I10 Essential (primary) hypertension: Secondary | ICD-10-CM

## 2023-01-29 DIAGNOSIS — Z79899 Other long term (current) drug therapy: Secondary | ICD-10-CM

## 2023-01-29 MED ORDER — MAGNESIUM CHLORIDE 64 MG PO TBEC: 1.0000 | DELAYED_RELEASE_TABLET | Freq: Two times a day (BID) | ORAL | 3 refills | Status: DC

## 2023-01-30 ENCOUNTER — Encounter: Payer: Self-pay | Admitting: Radiology

## 2023-02-12 ENCOUNTER — Other Ambulatory Visit (HOSPITAL_COMMUNITY): Payer: Self-pay | Admitting: Family Medicine

## 2023-02-12 ENCOUNTER — Encounter: Payer: Self-pay | Admitting: Cardiology

## 2023-02-12 DIAGNOSIS — Z1231 Encounter for screening mammogram for malignant neoplasm of breast: Secondary | ICD-10-CM

## 2023-02-12 DIAGNOSIS — E2839 Other primary ovarian failure: Secondary | ICD-10-CM

## 2023-02-13 ENCOUNTER — Telehealth: Payer: Self-pay | Admitting: *Deleted

## 2023-02-13 ENCOUNTER — Telehealth: Payer: Self-pay

## 2023-02-13 NOTE — Telephone Encounter (Signed)
Called and made the patient aware that she may proceed with the [facility]. PIN # provided to the patient. Patient made aware that she will be contacted after the test has been read with the results and any recommendations. Patient verbalized understanding and thanked me for the call.   

## 2023-02-13 NOTE — Telephone Encounter (Signed)
Prior Authorization for [employer] sent to [facility] via web portal. APPOVED. Administrator Number [id number]. Valid dates 01/31/23 to 05/03/23. Debria Garret notified ok to have the patient to activate the device.

## 2023-02-13 NOTE — Telephone Encounter (Signed)
I called the patient to speak to her about the Itamar Device, LVM for her to return my call.

## 2023-02-26 ENCOUNTER — Encounter (INDEPENDENT_AMBULATORY_CARE_PROVIDER_SITE_OTHER): Payer: Commercial Managed Care - PPO | Admitting: Cardiology

## 2023-02-26 DIAGNOSIS — G4733 Obstructive sleep apnea (adult) (pediatric): Secondary | ICD-10-CM | POA: Diagnosis not present

## 2023-02-27 ENCOUNTER — Ambulatory Visit: Payer: Commercial Managed Care - PPO | Attending: Cardiology

## 2023-02-27 DIAGNOSIS — R0683 Snoring: Secondary | ICD-10-CM

## 2023-02-27 DIAGNOSIS — R400 Somnolence: Secondary | ICD-10-CM

## 2023-02-27 NOTE — Procedures (Signed)
SLEEP STUDY REPORT Patient Information Study Date: 02/26/2023 Patient Name: Tracey Luna Patient ID: [id number] Birth Date: 07/01/1961 Age: 62 Gender: Female BMI: 33.5 (W=196 lb, H=5' 4'') Stopbang: 4 Referring Physician: Oswaldo Milian, MD  TEST DESCRIPTION: Home sleep apnea testing was completed using the WatchPat, a Type 1 device, utilizing peripheral arterial tonometry (PAT), chest movement, actigraphy, pulse oximetry, pulse rate, body position and snore. AHI was calculated with apnea and hypopnea using valid sleep time as the denominator. RDI includes apneas, hypopneas, and RERAs. The data acquired and the scoring of sleep and all associated events were performed in accordance with the recommended standards and specifications as outlined in the AASM Manual for the Scoring of Sleep and Associated Events 2.2.0 (2015).   FINDINGS:   1. Overall Mild Obstructive Sleep Apnea with AHI 6.1/hr but moderate during REM sleep with AHI 19/hr. All events occurred in the non-supine position.  2. No Central Sleep Apnea with pAHIc 0/hr.   3. Oxygen desaturations as low as 84%.   4. Moderate to severe snoring was present. O2 sats were < 88% for 3.5 min.   5. Total sleep time was 6 hrs and 32 min.   6. 18.1% of total sleep time was spent in REM sleep.   7. Normal sleep onset latency at 21 min.   8. Normal REM sleep onset latency at 88 min.   9. Total awakenings were 12.  10. Arrhythmia detection: None.  DIAGNOSIS: Mild Obstructive Sleep Apnea (G47.33)  RECOMMENDATIONS:   1.  Clinical correlation of these findings is necessary.  The decision to treat obstructive sleep apnea (OSA) is usually based on the presence of apnea symptoms or the presence of associated medical conditions such as Hypertension, Congestive Heart Failure, Atrial Fibrillation or Obesity.  The most common symptoms of OSA are snoring, gasping for breath while sleeping, daytime sleepiness and fatigue.   2.  Initiating  apnea therapy is recommended given the presence of symptoms and/or associated conditions. Recommend proceeding with one of the following:     a.  Auto-CPAP therapy with a pressure range of 5-20cm H2O.     b.  An oral appliance (OA) that can be obtained from certain dentists with expertise in sleep medicine.  These are primarily of use in non-obese patients with mild and moderate disease.     c.  An ENT consultation which may be useful to look for specific causes of obstruction and possible treatment options.     d.  If patient is intolerant to PAP therapy, consider referral to ENT for evaluation for hypoglossal nerve stimulator.   3.  Close follow-up is necessary to ensure success with CPAP or oral appliance therapy for maximum benefit.  4.  A follow-up oximetry study on CPAP is recommended to assess the adequacy of therapy and determine the need for supplemental oxygen or the potential need for Bi-level therapy.  An arterial blood gas to determine the adequacy of baseline ventilation and oxygenation should also be considered.  5.  Healthy sleep recommendations include:  adequate nightly sleep (normal 7-9 hrs/night), avoidance of caffeine after noon and alcohol near bedtime, and maintaining a sleep environment that is cool, dark and quiet.  6.  Weight loss for overweight patients is recommended.  Even modest amounts of weight loss can significantly improve the severity of sleep apnea.  7.  Snoring recommendations include:  weight loss where appropriate, side sleeping, and avoidance of alcohol before bed.  8.  Operation of motor vehicle should be  avoided when sleepy.  Signature: Fransico Him, MD; [facility]; [place], [place] Board of Sleep Medicine Electronically Signed: 02/27/2023

## 2023-03-10 ENCOUNTER — Ambulatory Visit (HOSPITAL_COMMUNITY)
Admission: RE | Admit: 2023-03-10 | Discharge: 2023-03-10 | Disposition: A | Discharge status: Home / Self Care | Payer: Commercial Managed Care - PPO | Source: Ambulatory Visit | Attending: Family Medicine | Admitting: Family Medicine

## 2023-03-10 DIAGNOSIS — Z1231 Encounter for screening mammogram for malignant neoplasm of breast: Secondary | ICD-10-CM | POA: Insufficient documentation

## 2023-03-10 DIAGNOSIS — E2839 Other primary ovarian failure: Secondary | ICD-10-CM | POA: Diagnosis present

## 2023-04-21 ENCOUNTER — Other Ambulatory Visit: Payer: Self-pay | Admitting: Cardiology

## 2023-04-29 ENCOUNTER — Telehealth: Payer: Self-pay

## 2023-04-29 NOTE — Telephone Encounter (Signed)
Left detailed VM per DPR. Patient notified that CPAP machine and supplies ordered today 04/29/23. CPAP order sent to [facility].

## 2023-05-09 ENCOUNTER — Encounter: Payer: Self-pay | Admitting: Cardiology

## 2023-05-09 ENCOUNTER — Other Ambulatory Visit: Payer: Self-pay

## 2023-05-09 DIAGNOSIS — I2510 Atherosclerotic heart disease of native coronary artery without angina pectoris: Secondary | ICD-10-CM

## 2023-05-09 DIAGNOSIS — E7800 Pure hypercholesterolemia, unspecified: Secondary | ICD-10-CM

## 2023-05-09 DIAGNOSIS — E785 Hyperlipidemia, unspecified: Secondary | ICD-10-CM

## 2023-05-09 MED ORDER — MAGNESIUM CHLORIDE 64 MG PO TBEC: 1.0000 | DELAYED_RELEASE_TABLET | Freq: Two times a day (BID) | ORAL | 3 refills | Status: AC

## 2023-05-09 MED ORDER — EZETIMIBE 10 MG PO TABS: 10.0000 mg | ORAL_TABLET | Freq: Every day | ORAL | 3 refills | Status: DC

## 2023-05-09 NOTE — Telephone Encounter (Signed)
Spoke to patient she will continue taking Atorvastatin 80 mg daily.She will start Zetia 10 mg daily.She will start taking Slow Mag 64 mg twice a day.Repeat fasting lipid panel in 3 months.She will have lipid panel done at her work and send Dr.Schumann results.

## 2023-05-09 NOTE — Telephone Encounter (Signed)
Cholesterol did not improved much with increasing atorvastatin to 80 mg daily.  Has she been taking atorva 80 mg daily?  If so, would add zetia 10 mg daily  Magnesium remains low, is she taking magnesium supplement?

## 2023-07-29 ENCOUNTER — Encounter: Payer: Self-pay | Admitting: Cardiology

## 2023-08-04 NOTE — Progress Notes (Unsigned)
[facility] Office Note:    Date:  08/07/2023   ID:  Tracey Luna, DOB March 13, 1961, MRN [id number]  PCP:  Richmond Campbell., PA-C  Cardiologist:  Little Ishikawa, MD  Electrophysiologist:  None   Referring MD: Richmond Campbell., PA-C   Chief Complaint  Patient presents with   Coronary Artery Disease    History of Present Illness:    Tracey Luna is a 62 y.o. female with a hx of TIA, hypertension, hyperlipidemia, mitral valve prolapse, aortic dilatation, tobacco use, COPD who presents for follow-up.  She was referred by Mady Gemma, PA for evaluation of aortic dilatation, initially seen 07/05/2021.  She previously followed with Dr. Jacinto Halim in cardiology, last seen 05/04/20.  Echocardiogram on 06/16/2019 showed normal LV systolic function, mild LVH, grade 1 diastolic dysfunction, mild MR.  Lexiscan Myoview on 06/28/2019 showed normal perfusion.  CT chest without contrast 12/04/2018 showed ascending aortic dilatation measuring 41 mm.  MRA chest 02/23/2020 showed stable ascending aortic dilatation measuring 41 mm.  She reports intermittent chest pain that she describes as sharp pain that lasts for few seconds and resolves.  Reports occasional shortness of breath.  She had COVID-19 infection in January 2022.  Had COPD exacerbation after this.  Reports occasional lightheadedness with standing, denies any syncope.  Reports intermittent lower extremity edema.  Reports having palpitations occur about once per week, can last for hours.  Smokes 0.5 to 0.75 ppd, has smoked for over 40 years.  Family history includes both her sister and mother have Marfan's and her sister had her thoracic aortic aneurysm repaired.  Her father has had multiple MIs, first in his 72s, and had a pacemaker.  Zio patch x12 days on 08/03/2021 showed 2 episodes of SVT, longest lasting 2 hours 13 minutes with average rate 118 bpm.  Started on Toprol-XL 25 mg daily.  Echocardiogram 10/21/2022 showed normal biventricular  function, no significant valvular disease.  Coronary CTA on 01/09/2023 showed nonobstructive CAD, calcium score 396 (96 percentile), dilated ascending aorta measuring 42 mm.  Since last clinic visit, she reports she is doing okay.  Reports occasional sharp chest pain.  Her smoking is increased, now about 1 pack/day.  She has nicotine patches but has not started.  She did not start using her CPAP.  She continues to have shortness of breath.  Reports some lightheadedness but denies any syncope.  Also has left lower extremity swelling.   Past Medical History:  Diagnosis Date   Anxiety    Complication of anesthesia    COPD (chronic obstructive pulmonary disease) (HCC)    GERD (gastroesophageal reflux disease)    GERD (gastroesophageal reflux disease) 09/24/2016   Hyperglycemia    Hyperlipidemia    Hypertension    PONV (postoperative nausea and vomiting)    Pre-diabetes    Stroke (HCC)    TIA, no deficits   Varicose veins    Vitamin D deficiency     Past Surgical History:  Procedure Laterality Date   CHOLECYSTECTOMY     EXCISION ORAL TUMOR Right 05/20/2017   Procedure: EXCISION RIGHT BUCCAL MUCOSAL LESION;  Surgeon: Newman Pies, MD;  Location: [facility];  Service: ENT;  Laterality: Right;    Current Medications: Current Meds  Medication Sig   albuterol (VENTOLIN HFA) 108 (90 Base) MCG/ACT inhaler Inhale into the lungs as needed.   amLODipine (NORVASC) 5 MG tablet Take 1 tablet (5 mg total) by mouth daily.   aspirin EC 81 MG tablet Take 1  tablet (81 mg total) by mouth daily. Swallow whole.   atorvastatin (LIPITOR) 80 MG tablet Take 1 tablet (80 mg total) by mouth daily.   carvedilol (COREG) 6.25 MG tablet Take 1 tablet (6.25 mg total) by mouth 2 (two) times daily.   ezetimibe (ZETIA) 10 MG tablet Take 1 tablet (10 mg total) by mouth daily.   Fluticasone-Umeclidin-Vilant (TRELEGY ELLIPTA) 100-62.5-25 MCG/INH AEPB Inhale 1 puff into the lungs daily.   glipiZIDE (GLUCOTROL  XL) 2.5 MG 24 hr tablet Take 1 tablet by mouth daily.   LORazepam (ATIVAN) 0.5 MG tablet Take 0.5 mg by mouth as needed.   losartan (COZAAR) 100 MG tablet TAKE 1 TABLET(100 MG) BY MOUTH DAILY   metFORMIN (GLUCOPHAGE-XR) 500 MG 24 hr tablet Take one pill with your largest meal   nicotine (NICODERM CQ - DOSED IN MG/24 HOURS) 14 mg/24hr patch Place 1 patch (14 mg total) onto the skin daily.   nicotine (NICODERM CQ - DOSED IN MG/24 HR) 7 mg/24hr patch Place 1 patch (7 mg total) onto the skin daily.   nystatin cream (MYCOSTATIN) nystatin 100,000 unit/gram topical cream  APP EXT AA BID   omeprazole (PRILOSEC) 40 MG capsule Take 40 mg by mouth daily.     Allergies:   Avelox [moxifloxacin hcl in nacl], Penicillins, and Vancomycin   Social History   Socioeconomic History   Marital status: Married    Spouse name: Not on file   Number of children: 3   Years of education: Not on file   Highest education level: Not on file  Occupational History   Not on file  Tobacco Use   Smoking status: Every Day    Current packs/day: 0.50    Average packs/day: 0.5 packs/day for 30.0 years (15.0 ttl pk-yrs)    Types: Cigarettes   Smokeless tobacco: Never   Tobacco comments:    1/2 pack per day   Vaping Use   Vaping status: Never Used  Substance and Sexual Activity   Alcohol use: Yes    Alcohol/week: 0.0 standard drinks of alcohol    Comment: occ   Drug use: No   Sexual activity: Not on file  Other Topics Concern   Not on file  Social History Narrative   Not on file   Social Determinants of Health   Financial Resource Strain: Unknown (02/11/2021)   [facility] prior to 02/01/2023., [facility] visits prior to 02/01/2023.   Overall Financial Resource Strain (CARDIA)    Difficulty of Paying Living Expenses: Patient refused  Food Insecurity: Low Risk  (05/06/2023)   [facility]   Hunger Vital Sign    Worried  About Running Out of Food in the Last Year: Never true    Ran Out of Food in the Last Year: Never true  Transportation Needs: No Transportation Needs (05/06/2023)   [facility]   Transportation    In the past 12 months, has lack of reliable transportation kept you from medical appointments, meetings, work or from getting things needed for daily living? : No  Physical Activity: Unknown (02/11/2021)   [facility] prior to 02/01/2023., [facility] visits prior to 02/01/2023.   Exercise Vital Sign    Days of Exercise per Week: Patient refused    Minutes of Exercise per Session: Not on file  Stress: No Stress Concern Present (02/11/2021)  [facility] prior to 02/01/2023., [facility] visits prior to 02/01/2023.   [employer] of Occupational Health - Occupational Stress Questionnaire    Feeling of Stress : Not at all  Social Connections: Unknown (02/11/2021)   [facility] prior to 02/01/2023., [facility] visits prior to 02/01/2023.   Social Advertising account executive [NHANES]    Frequency of Communication with Friends and Family: More than three times a week    Frequency of Social Gatherings with Friends and Family: Once a week    Attends Religious Services: Patient refused    Database administrator or Organizations: Patient refused    Attends Engineer, structural: Patient refused    Marital Status: Married     Family History: The patient's family history includes Bleeding Disorder in her mother; Diabetes in her brother; Heart attack in her father; Heart disease in her father, mother, and sister; Hyperlipidemia in her father; Hypertension in her brother, father, and sister; Varicose Veins in her mother.  ROS:   Please see the history of present illness.     All  other systems reviewed and are negative.  EKGs/Labs/Other Studies Reviewed:    The following studies were reviewed today:   EKG:   10/15/22: normal sinus rhythm, rate 69, no ST/T abnormality 08/07/23: NSR, rate 83, no ST abnormality  Recent Labs: 11/18/2022: ALT 19; Hemoglobin 12.8; Platelets 254 01/27/2023: BUN 16; Creatinine, Ser 0.73; Magnesium 1.4; Potassium 4.2; Sodium 141  Recent Lipid Panel    Component Value Date/Time   CHOL 178 10/15/2022 1043   TRIG 158 (H) 10/15/2022 1043   HDL 37 (L) 10/15/2022 1043   CHOLHDL 4.8 (H) 10/15/2022 1043   LDLCALC 113 (H) 10/15/2022 1043    Physical Exam:    VS:  BP 124/86   Pulse 83   Ht 5\' 4"  (1.626 m)   Wt 202 lb 12.8 oz (92 kg)   SpO2 94%   BMI 34.81 kg/m     Wt Readings from Last 3 Encounters:  08/07/23 202 lb 12.8 oz (92 kg)  01/27/23 196 lb 3.2 oz (89 kg)  11/18/22 188 lb (85.3 kg)     GEN:  Well nourished, well developed in no acute distress HEENT: Normal NECK: No JVD; No carotid bruits CARDIAC: RRR, 2/6 systolic murmur RESPIRATORY:  Clear to auscultation without rales, wheezing or rhonchi  ABDOMEN: Soft, non-tender, non-distended MUSCULOSKELETAL:  No edema; No deformity  SKIN: Warm and dry NEUROLOGIC:  Alert and oriented x 3 PSYCHIATRIC:  Normal affect   ASSESSMENT:    1. Coronary artery disease involving native coronary artery of native heart without angina pectoris   2. Primary hypertension   3. Ascending aorta dilatation (HCC)   4. Mixed restrictive and obstructive lung disease (HCC)   5. Chronic diarrhea   6. Medication management   7. Localized edema       PLAN:    CAD: Reported atypical chest pain.  Coronary CTA on 01/09/2023 showed nonobstructive CAD, calcium score 396 (96 percentile), dilated ascending aorta measuring 42 mm.  Echocardiogram 10/21/2022 showed normal biventricular function, no significant valvular disease.  -Continue aspirin 81 mg daily -Continue atorvastatin 80 mg daily  Aortic  dilatation: Measured 41 mm on MRA chest 02/23/2020.  She reports she is not able to tolerate MRIs due to claustrophobia.  CTA chest 10/08/2022 showed stable 41 mm ascending aorta.  Coronary CTA  01/2023 showed 42 mm ascending aorta  Hypertension: On losartan 100 mg daily, carvedilol 6.25 mg twice daily, amlodipine 5 mg daily.  Previously on HCTZ but discontinued due to hypomagnesemia.  BP appears controlled  Hyperlipidemia: LDL 113 on 10/15/2022, on atorvastatin 40 mg daily.  Atorvastatin increased to 80 mg daily 01/2023 given coronary CTA results as above.  LDL 109 04/2023, Zetia 10 mg daily added.  LDL 76 on 07/23/2023  Tobacco use: Patient counseled on the risk of tobacco use and cessation strongly encouraged.  She has nicotine patches, but has not started using.  Encouraged to try nicotine patches  SVT: Zio patch x12 days on 08/03/2021 showed 2 episodes of SVT, longest lasting 2 hours 13 minutes with average rate 118 bpm.   -Continue carvedilol  OSA: Positive sleep study 01/2023, CPAP recommended but she did not start.  Will refer to sleep medicine  T2DM: On metformin.  A1c 7.0% 07/2023  Hypomagnesemia: Magnesium 1.4 07/2023.  Suspect was related to HCTZ use but she discontinued HCTZ and her magnesium remains low.  She does report has issues with chronic diarrhea, suspect this is contributing.  Will refer to GI.  She did not start a magnesium supplement, recommend starting and checking magnesium level in 2 weeks  RTC in 6 months   Medication Adjustments/Labs and Tests Ordered: Current medicines are reviewed at length with the patient today.  Concerns regarding medicines are outlined above.  Orders Placed This Encounter  Procedures   Basic metabolic panel   Magnesium   Ambulatory referral to Pulmonology   Ambulatory referral to Gastroenterology   EKG 12-Lead    No orders of the defined types were placed in this encounter.    Patient Instructions  Medication Instructions:  Restart  Magnesium *If you need a refill on your cardiac medications before your next appointment, please call your pharmacy*   Lab Work: BMP, Mag- in 2 weeks after restarting the Magnesium supplement If you have labs (blood work) drawn today and your tests are completely normal, you will receive your results only by: MyChart Message (if you have MyChart) OR A paper copy in the mail If you have any lab test that is abnormal or we need to change your treatment, we will call you to review the results.   Follow-Up: At [facility], you and your health needs are our priority.  As part of our continuing mission to provide you with exceptional heart care, we have created designated Provider Care Teams.  These Care Teams include your primary Cardiologist (physician) and Advanced Practice Providers (APPs -  Physician Assistants and Nurse Practitioners) who all work together to provide you with the care you need, when you need it.  We recommend signing up for the patient portal called "MyChart".  Sign up information is provided on this After Visit Summary.  MyChart is used to connect with patients for Virtual Visits (Telemedicine).  Patients are able to view lab/test results, encounter notes, upcoming appointments, etc.  Non-urgent messages can be sent to your provider as well.   To learn more about what you can do with MyChart, go to [email or web address].    Your next appointment:   6 month(s)  Provider:   Little Ishikawa, MD     Wear compression stockings during waking hours on left leg. Elevate leg periodically throughout the day.     Signed, Little Ishikawa, MD  08/07/2023 10:22 PM    [facility]

## 2023-08-07 ENCOUNTER — Ambulatory Visit: Payer: Commercial Managed Care - PPO | Attending: Cardiology | Admitting: Cardiology

## 2023-08-07 ENCOUNTER — Encounter: Payer: Self-pay | Admitting: Cardiology

## 2023-08-07 VITALS — BP 124/86 | HR 83 | Ht 64.0 in | Wt 202.8 lb

## 2023-08-07 DIAGNOSIS — J439 Emphysema, unspecified: Secondary | ICD-10-CM

## 2023-08-07 DIAGNOSIS — I2510 Atherosclerotic heart disease of native coronary artery without angina pectoris: Secondary | ICD-10-CM

## 2023-08-07 DIAGNOSIS — Z79899 Other long term (current) drug therapy: Secondary | ICD-10-CM

## 2023-08-07 DIAGNOSIS — I10 Essential (primary) hypertension: Secondary | ICD-10-CM

## 2023-08-07 DIAGNOSIS — I77810 Thoracic aortic ectasia: Secondary | ICD-10-CM

## 2023-08-07 DIAGNOSIS — K529 Noninfective gastroenteritis and colitis, unspecified: Secondary | ICD-10-CM

## 2023-08-07 DIAGNOSIS — R600 Localized edema: Secondary | ICD-10-CM

## 2023-08-07 DIAGNOSIS — J984 Other disorders of lung: Secondary | ICD-10-CM

## 2023-08-07 NOTE — Patient Instructions (Addendum)
Medication Instructions:  Restart Magnesium *If you need a refill on your cardiac medications before your next appointment, please call your pharmacy*   Lab Work: BMP, Mag- in 2 weeks after restarting the Magnesium supplement If you have labs (blood work) drawn today and your tests are completely normal, you will receive your results only by: MyChart Message (if you have MyChart) OR A paper copy in the mail If you have any lab test that is abnormal or we need to change your treatment, we will call you to review the results.   Follow-Up: At [facility], you and your health needs are our priority.  As part of our continuing mission to provide you with exceptional heart care, we have created designated Provider Care Teams.  These Care Teams include your primary Cardiologist (physician) and Advanced Practice Providers (APPs -  Physician Assistants and Nurse Practitioners) who all work together to provide you with the care you need, when you need it.  We recommend signing up for the patient portal called "MyChart".  Sign up information is provided on this After Visit Summary.  MyChart is used to connect with patients for Virtual Visits (Telemedicine).  Patients are able to view lab/test results, encounter notes, upcoming appointments, etc.  Non-urgent messages can be sent to your provider as well.   To learn more about what you can do with MyChart, go to [email or web address].    Your next appointment:   6 month(s)  Provider:   Little Ishikawa, MD     Wear compression stockings during waking hours on left leg. Elevate leg periodically throughout the day.

## 2023-08-14 ENCOUNTER — Encounter (INDEPENDENT_AMBULATORY_CARE_PROVIDER_SITE_OTHER): Payer: Self-pay

## 2023-08-15 ENCOUNTER — Encounter (INDEPENDENT_AMBULATORY_CARE_PROVIDER_SITE_OTHER): Payer: Self-pay

## 2023-08-19 ENCOUNTER — Encounter (INDEPENDENT_AMBULATORY_CARE_PROVIDER_SITE_OTHER): Payer: Self-pay | Admitting: *Deleted

## 2023-08-19 NOTE — Telephone Encounter (Signed)
Entered in error

## 2023-09-05 ENCOUNTER — Encounter (HOSPITAL_BASED_OUTPATIENT_CLINIC_OR_DEPARTMENT_OTHER): Payer: Self-pay

## 2023-10-16 ENCOUNTER — Other Ambulatory Visit: Payer: Self-pay | Admitting: Cardiology

## 2023-11-10 IMAGING — MG MM DIGITAL SCREENING BILAT W/ TOMO AND CAD
8 series · 8 of 24 positions shown · non-contrast
Comparison: Previous exam(s).

CLINICAL DATA: Screening.

EXAM:
DIGITAL SCREENING BILATERAL MAMMOGRAM WITH TOMOSYNTHESIS AND CAD
TECHNIQUE: Bilateral screening digital craniocaudal and mediolateral oblique
mammograms were obtained. Bilateral screening digital breast
tomosynthesis was performed. The images were evaluated with
computer-aided detection.

[R MLO synth-2D]
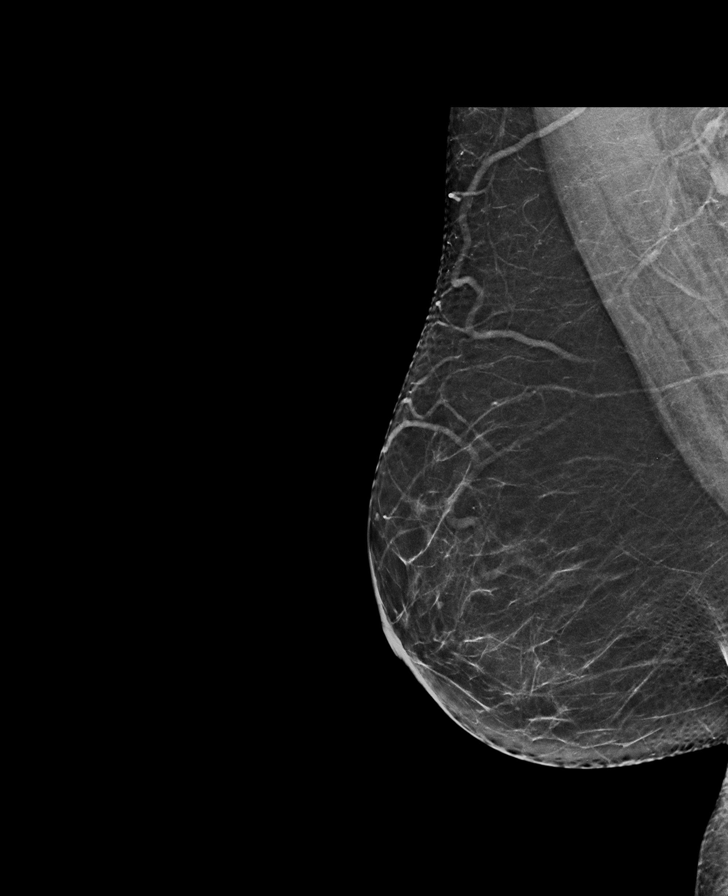

[R CC synth-2D]
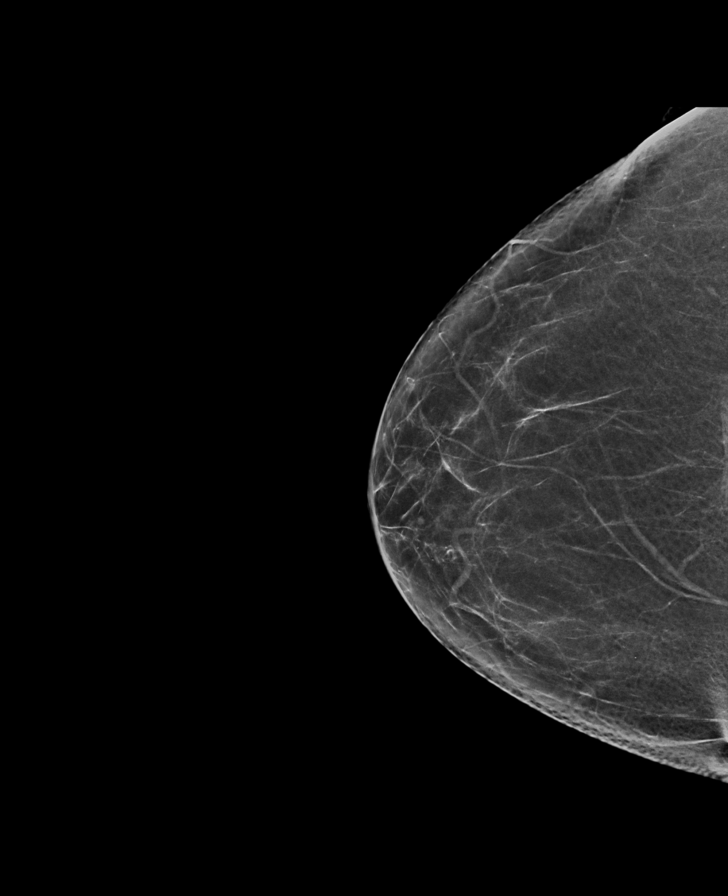

[L CC synth-2D]
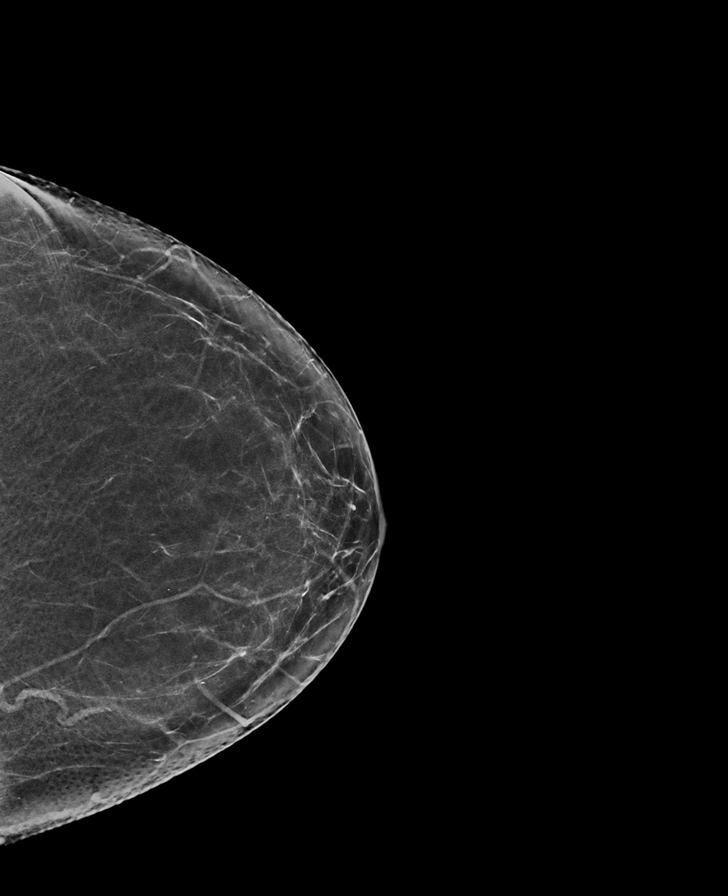

[L MLO synth-2D]
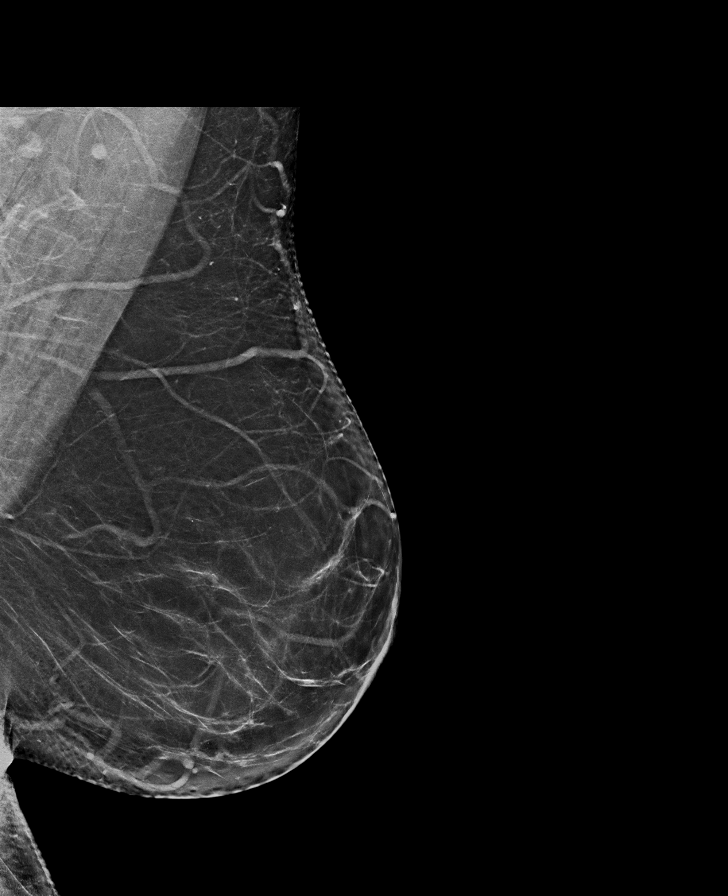

[L CC tomo · tomo slice 36/71.0]
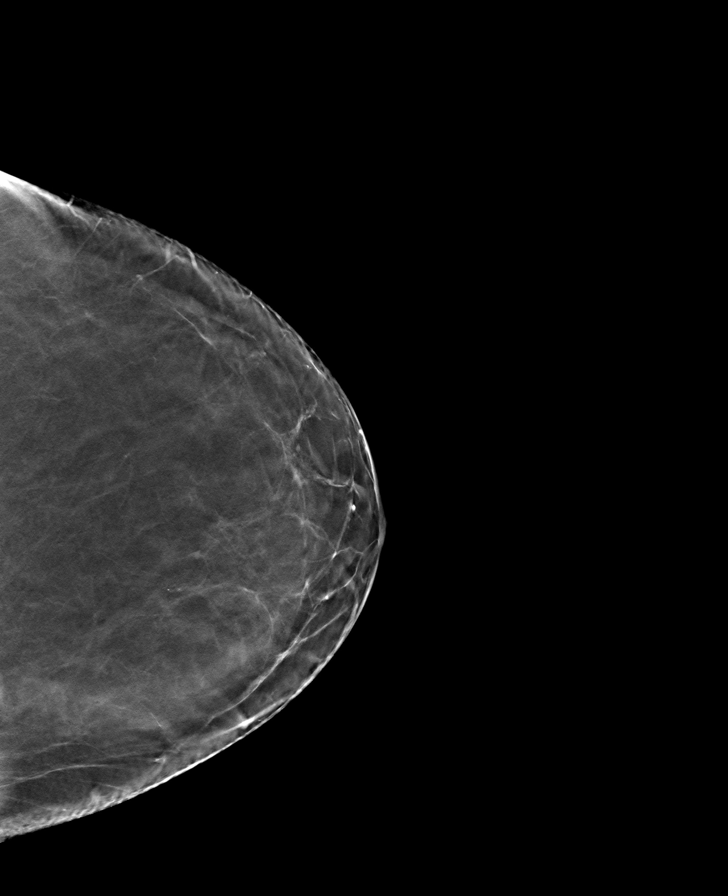

[R MLO tomo · tomo slice 37/72.0]
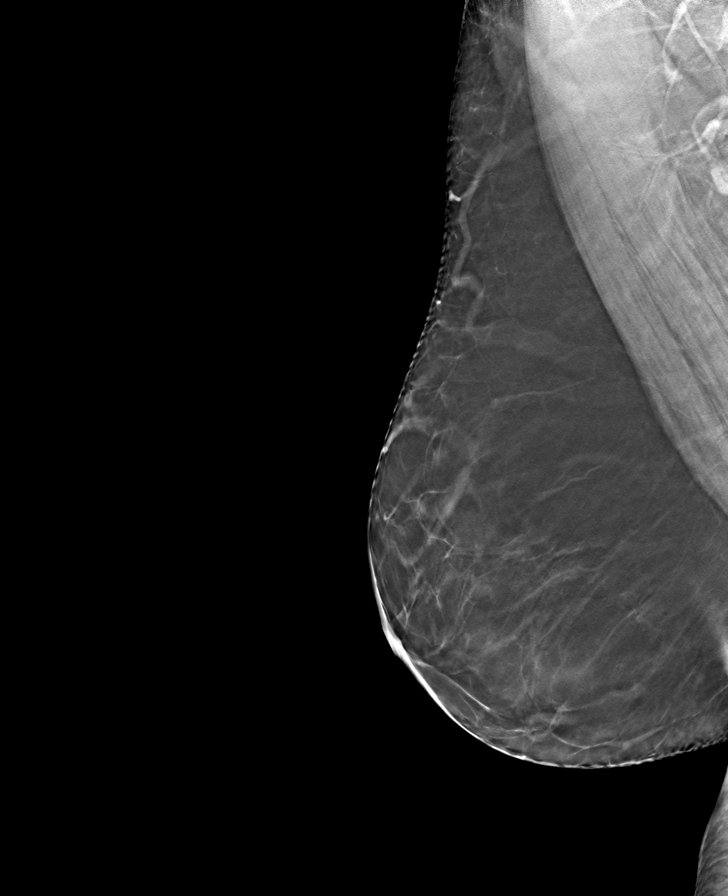

[L MLO tomo · tomo slice 40/79.0]
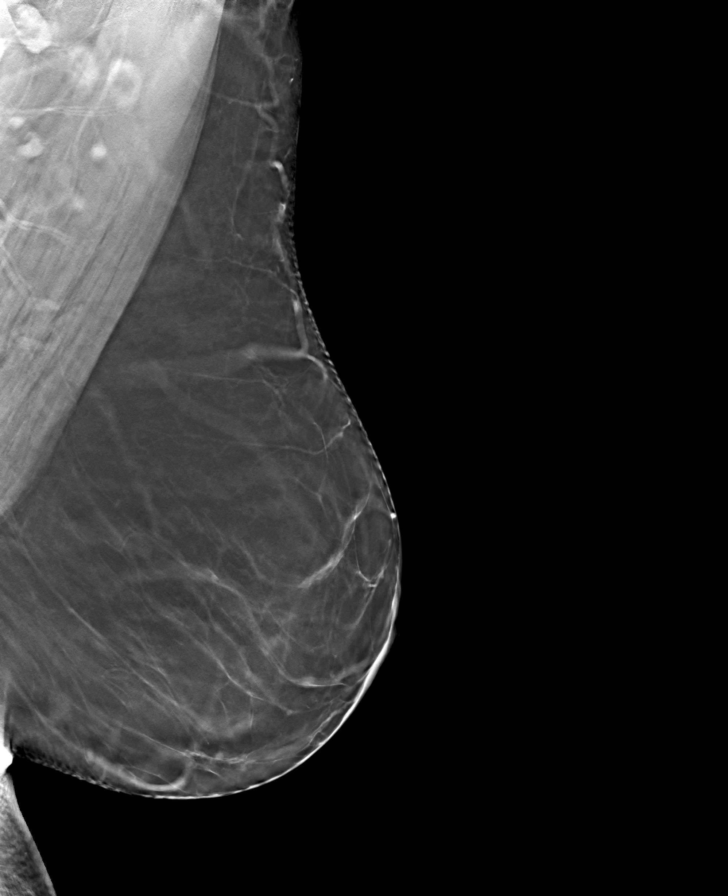

[R CC tomo · tomo slice 35/68.0]
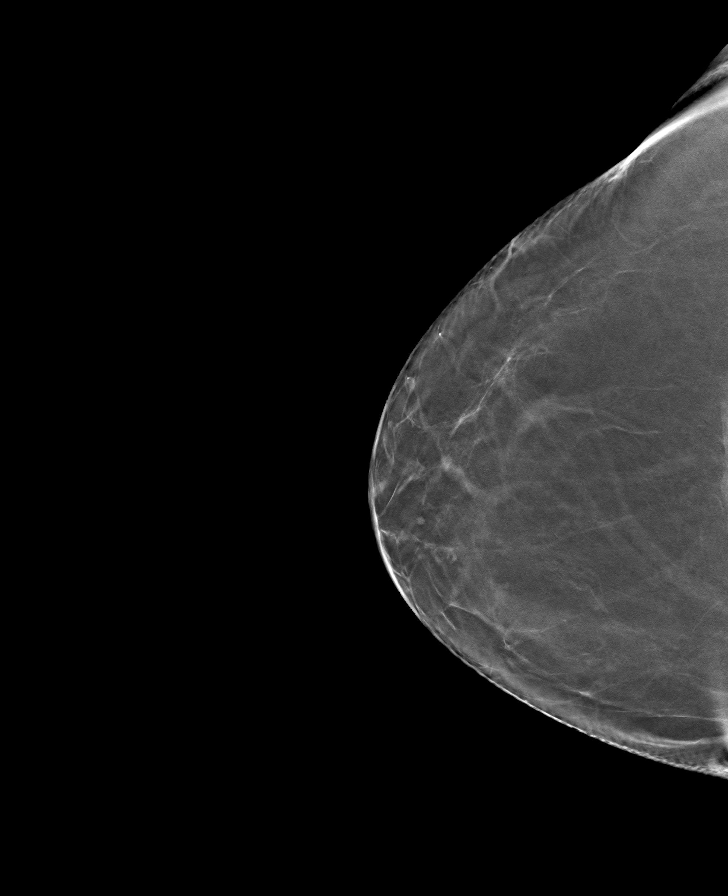

[8 of 24 positions shown; findings below may reference images not displayed]

ACR Breast Density Category b: There are scattered areas of
fibroglandular density.
FINDINGS: There are no findings suspicious for malignancy.
IMPRESSION: No mammographic evidence of malignancy. A result letter of this
screening mammogram will be mailed directly to the patient.

RECOMMENDATION:
Screening mammogram in one year. (Code:51-O-LD2)

BI-RADS CATEGORY  1: Negative.

## 2023-12-23 IMAGING — US US EXTREM LOW VENOUS*L*
1 series · 13 of 24 positions shown · non-contrast
Comparison: None.

CLINICAL DATA: Left lower extremity edema.



[Series 1: us venous img lower uni left (dvt) · portal-venous · 13 of 40 slices shown]
[im 1/40]
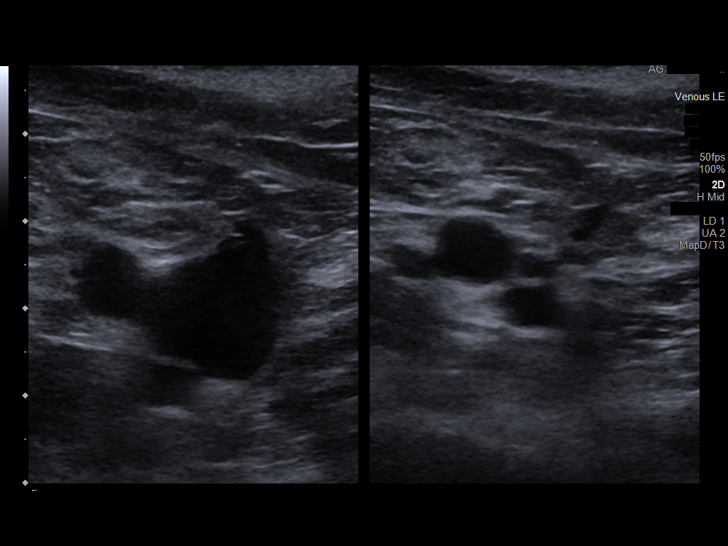
[im 4/40]
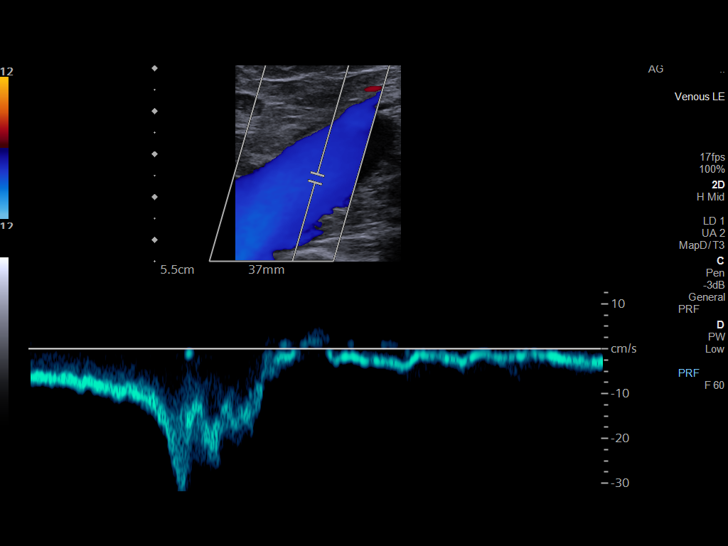
[im 7/40]
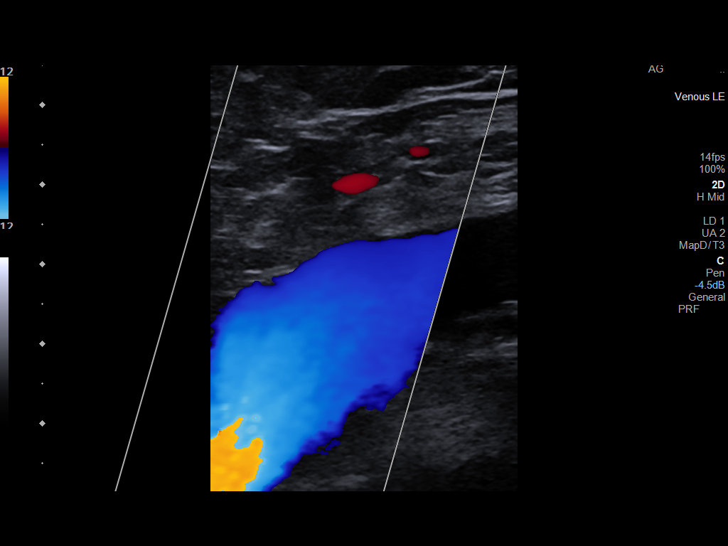
[im 11/40]
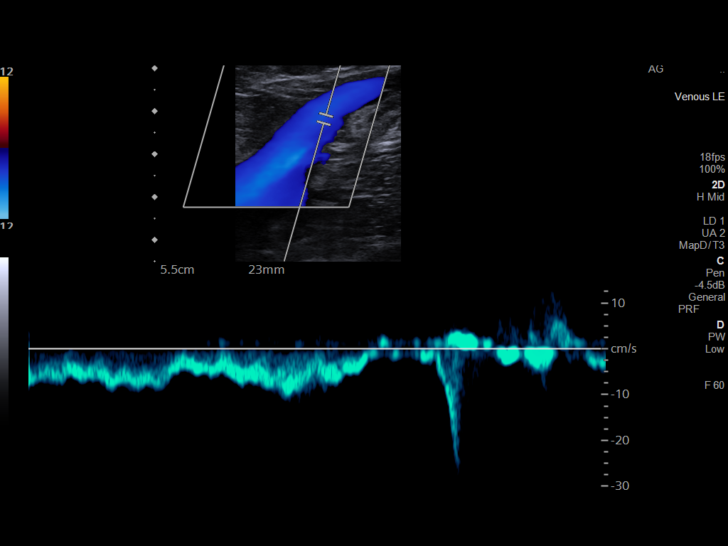
[im 14/40]
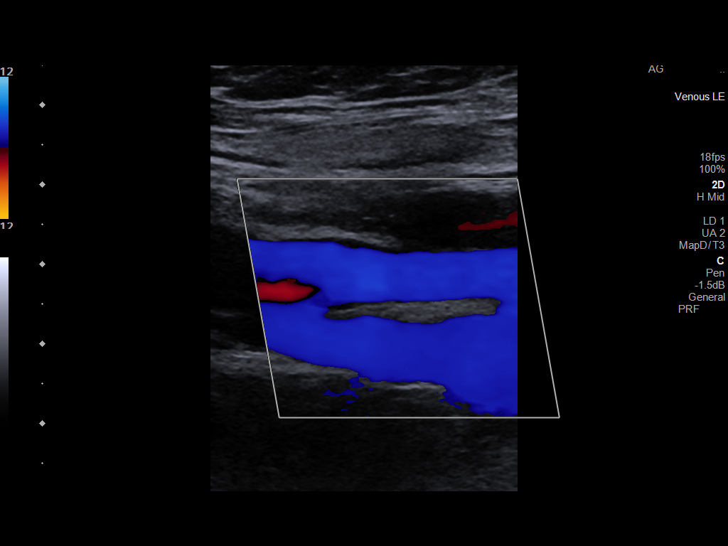
[im 17/40]
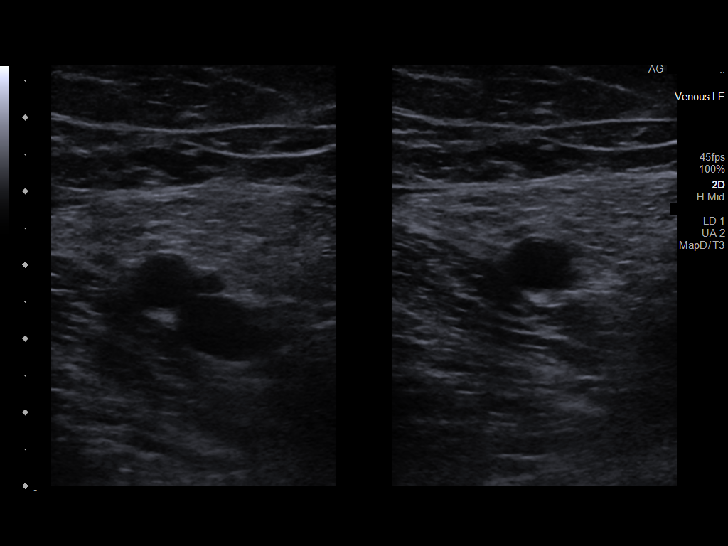
[im 21/40]
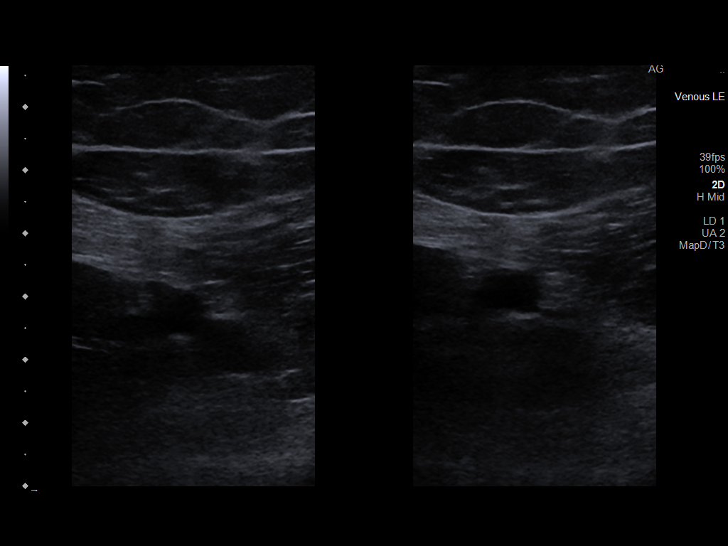
[im 23/40]
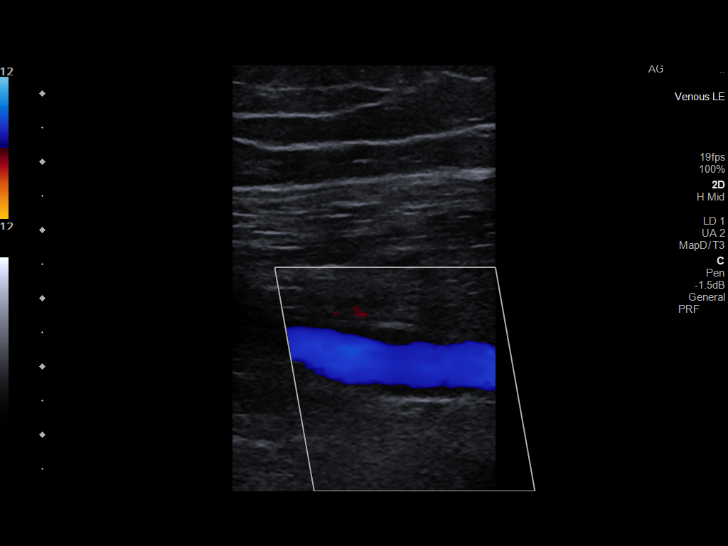
[im 26/40]
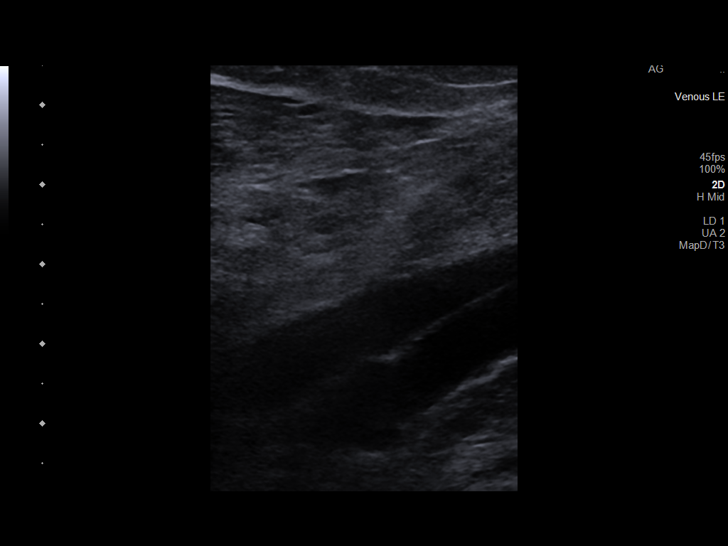
[im 29/40]
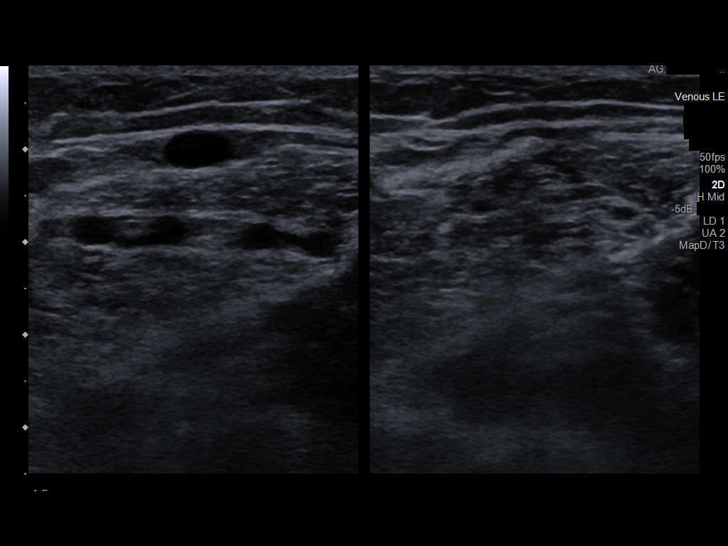
[im 33/40]
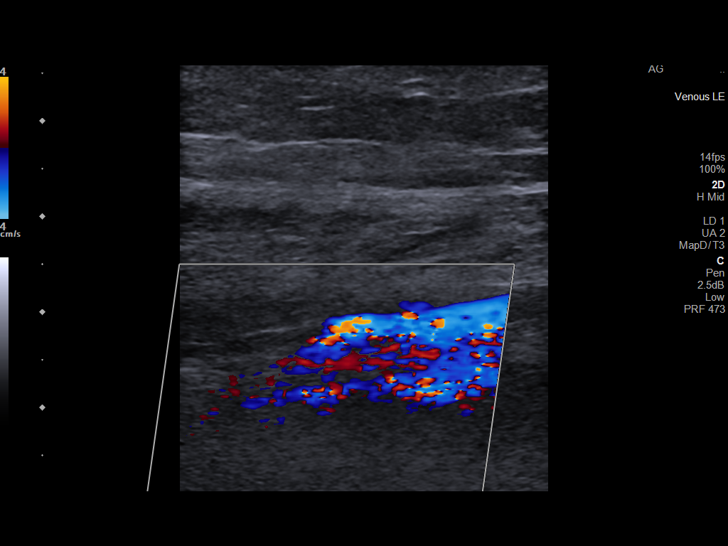
[im 36/40]
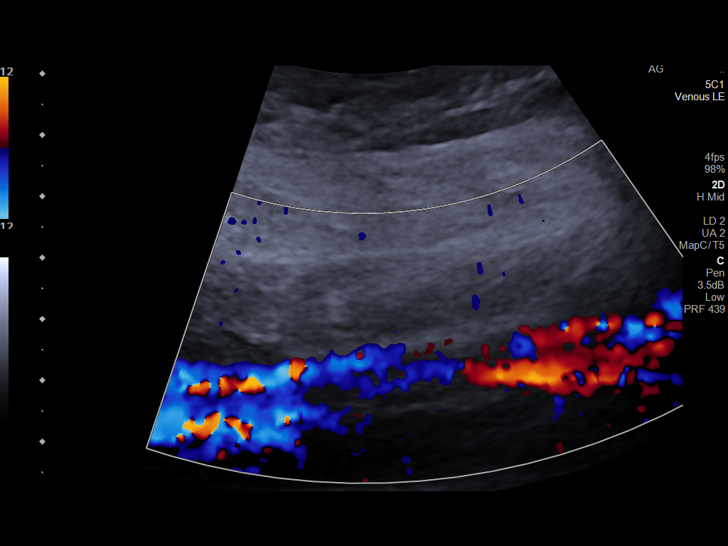
[im 40/40]
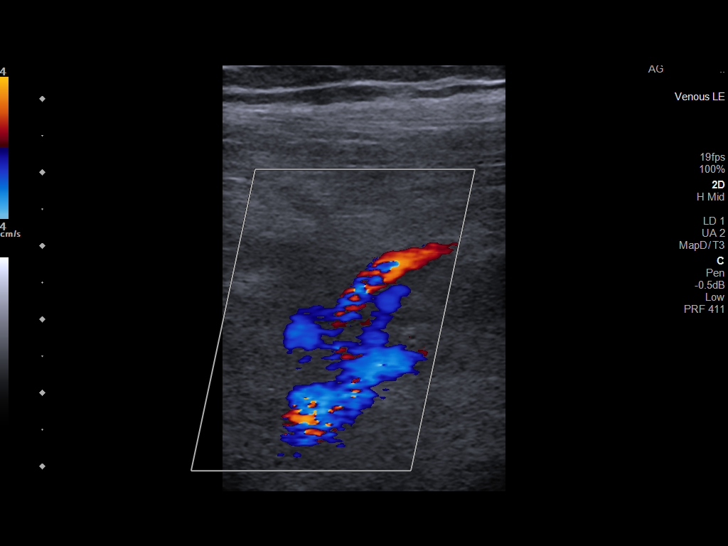

[13 of 24 positions shown; findings below may reference images not displayed]

FINDINGS: Contralateral Common Femoral Vein: Respiratory phasicity is normal
and symmetric with the symptomatic side. No evidence of thrombus.
Normal compressibility.

Common Femoral Vein: No evidence of thrombus. Normal
compressibility, respiratory phasicity and response to augmentation.

Saphenofemoral Junction: No evidence of thrombus. Normal
compressibility and flow on color Doppler imaging.

Profunda Femoral Vein: No evidence of thrombus. Normal
compressibility and flow on color Doppler imaging.

Femoral Vein: No evidence of thrombus. Normal compressibility,
respiratory phasicity and response to augmentation.

Popliteal Vein: No evidence of thrombus. Normal compressibility,
respiratory phasicity and response to augmentation.

Calf Veins: No evidence of thrombus. Normal compressibility and flow
on color Doppler imaging.

Superficial Great Saphenous Vein: No evidence of thrombus. Normal
compressibility.

Venous Reflux:  None.

Other Findings: No evidence of superficial thrombophlebitis or
abnormal fluid collection.
IMPRESSION: No evidence of left lower extremity deep venous thrombosis.

## 2023-12-23 IMAGING — DX DG KNEE 3 VIEWS*L*
3 series · 3 of 3 positions shown · non-contrast
Comparison: Left knee x-ray 02/15/2021

CLINICAL DATA: Left knee pain, increasing over the last year.

EXAM:
LEFT KNEE - 3 VIEW

[knee ap]
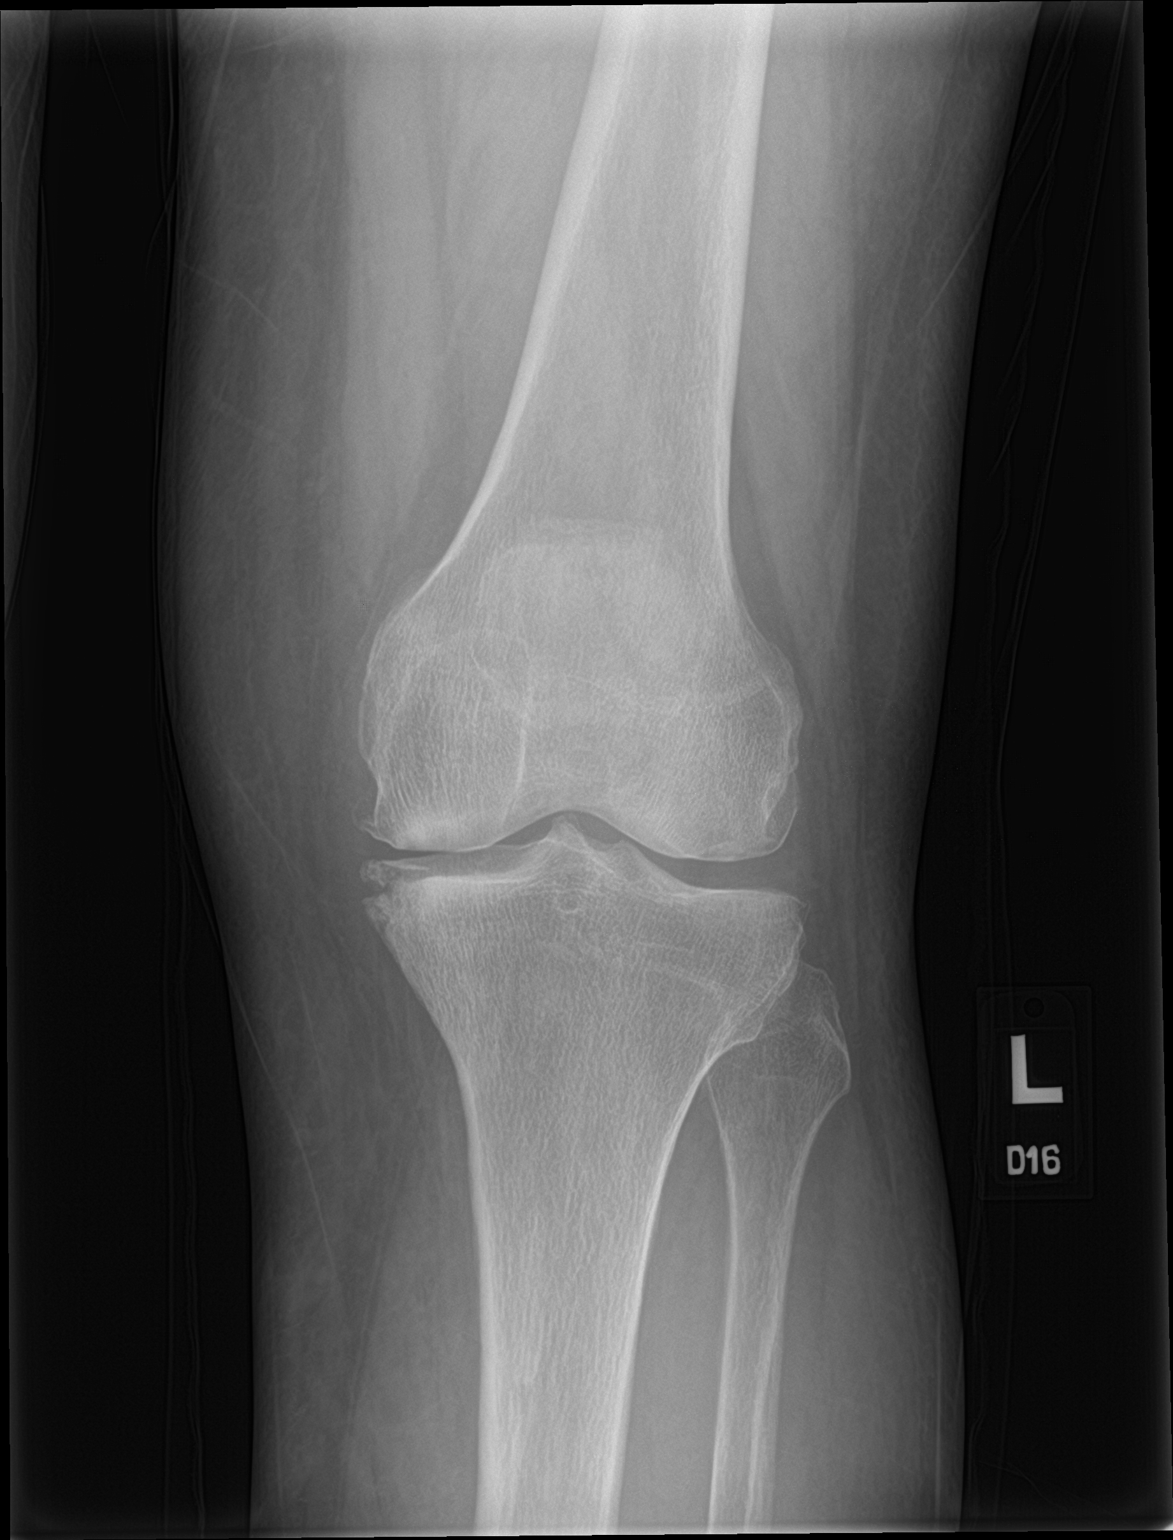

[knee sunrise]
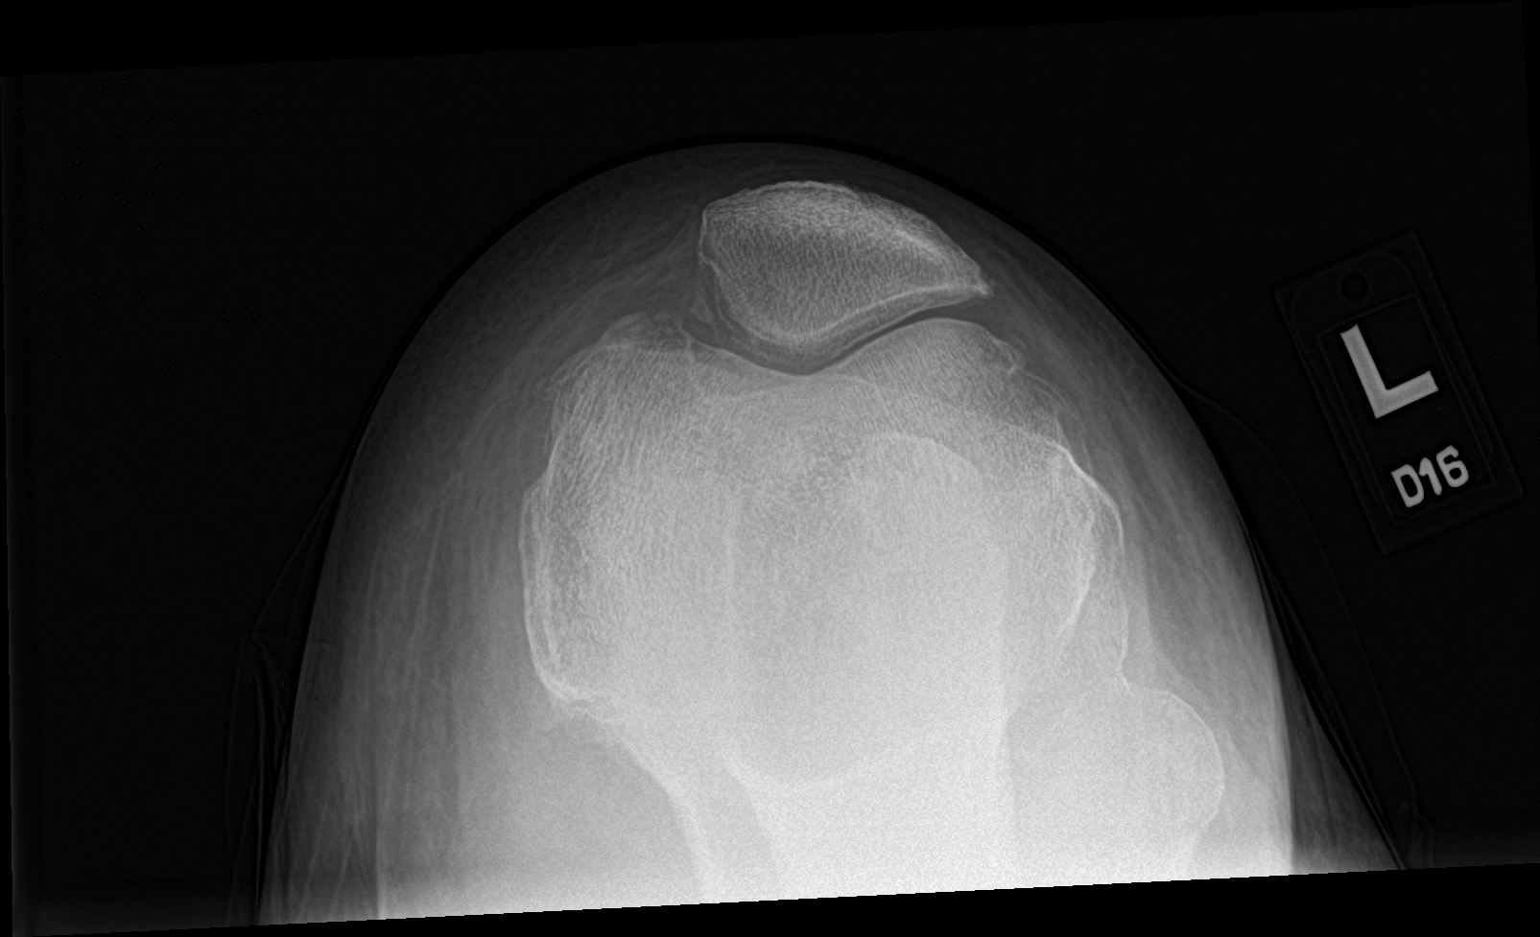

[knee lat]
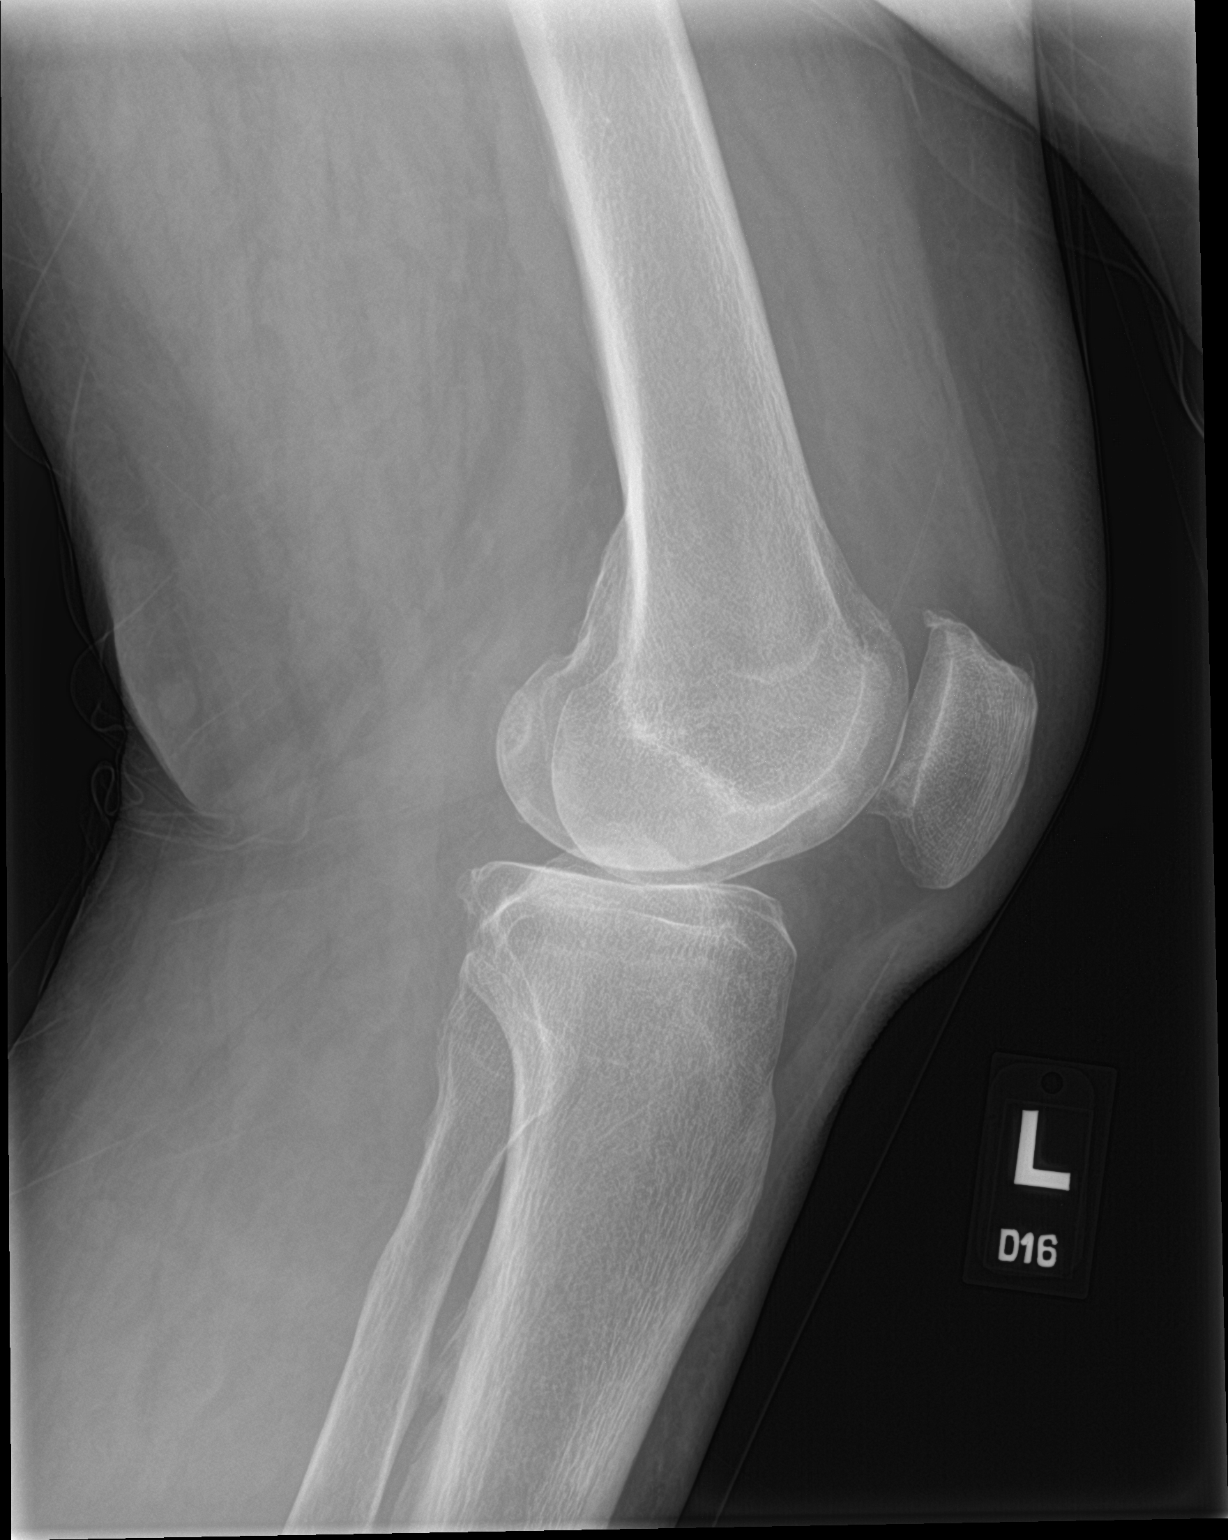

[3 of 3 positions shown; findings below may reference images not displayed]

FINDINGS: Small joint effusion present. There is some fragmentation of the
medial aspect of the medial tibial plateau which appears subacute.
There is intra-articular extension. Joint spaces are maintained.
Alignment is anatomic.
IMPRESSION: 1. Fragmentation medial aspect of the medial tibial plateau
suspicious for subacute fracture.
2. Small joint effusion.

## 2024-01-07 ENCOUNTER — Other Ambulatory Visit: Payer: Self-pay | Admitting: *Deleted

## 2024-01-25 ENCOUNTER — Other Ambulatory Visit: Payer: Self-pay | Admitting: Cardiology

## 2024-02-09 ENCOUNTER — Other Ambulatory Visit: Payer: Self-pay | Admitting: Cardiology

## 2024-02-12 ENCOUNTER — Ambulatory Visit: Payer: Commercial Managed Care - PPO | Attending: Cardiology | Admitting: Cardiology

## 2024-02-12 ENCOUNTER — Encounter: Payer: Self-pay | Admitting: Cardiology

## 2024-02-12 VITALS — BP 124/80 | HR 68 | Ht 64.0 in | Wt 182.2 lb

## 2024-02-12 DIAGNOSIS — I10 Essential (primary) hypertension: Secondary | ICD-10-CM

## 2024-02-12 DIAGNOSIS — I471 Supraventricular tachycardia, unspecified: Secondary | ICD-10-CM

## 2024-02-12 DIAGNOSIS — I77810 Thoracic aortic ectasia: Secondary | ICD-10-CM | POA: Diagnosis not present

## 2024-02-12 DIAGNOSIS — Z720 Tobacco use: Secondary | ICD-10-CM

## 2024-02-12 DIAGNOSIS — I2510 Atherosclerotic heart disease of native coronary artery without angina pectoris: Secondary | ICD-10-CM | POA: Diagnosis not present

## 2024-02-12 DIAGNOSIS — E785 Hyperlipidemia, unspecified: Secondary | ICD-10-CM

## 2024-02-12 NOTE — Patient Instructions (Signed)
 Medication Instructions:   NO CHANGE  *If you need a refill on your cardiac medications before your next appointment, please call your pharmacy*   Lab Work:  NONE NEEDED  If you have labs (blood work) drawn today and your tests are completely normal, you will receive your results only by: MyChart Message (if you have MyChart) OR A paper copy in the mail If you have any lab test that is abnormal or we need to change your treatment, we will call you to review the results.   Testing/Procedures:  Your physician has requested that you have an echocardiogram. Echocardiography is a painless test that uses sound waves to create images of your heart. It provides your doctor with information about the size and shape of your heart and how well your heart's chambers and valves are working. This procedure takes approximately one hour. There are no restrictions for this procedure. Please do NOT wear cologne, perfume, aftershave, or lotions (deodorant is allowed). Please arrive 15 minutes prior to your appointment time.  Please note: We ask at that you not bring children with you during ultrasound (echo/ vascular) testing. Due to room size and safety concerns, children are not allowed in the ultrasound rooms during exams. Our front office staff cannot provide observation of children in our lobby area while testing is being conducted. An adult accompanying a patient to their appointment will only be allowed in the ultrasound room at the discretion of the ultrasound technician under special circumstances. We apologize for any inconvenience. [address]   Follow-Up: At [facility], you and your health needs are our priority.  As part of our continuing mission to provide you with exceptional heart care, we have created designated Provider Care Teams.  These Care Teams include your primary Cardiologist (physician) and Advanced Practice Providers (APPs -  Physician Assistants and Nurse  Practitioners) who all work together to provide you with the care you need, when you need it.  We recommend signing up for the patient portal called "MyChart".  Sign up information is provided on this After Visit Summary.  MyChart is used to connect with patients for Virtual Visits (Telemedicine).  Patients are able to view lab/test results, encounter notes, upcoming appointments, etc.  Non-urgent messages can be sent to your provider as well.   To learn more about what you can do with MyChart, go to [email or web address].    Your next appointment:   12 month(s)  Provider:   Little Ishikawa, MD

## 2024-02-12 NOTE — Progress Notes (Signed)
 [facility] Office Note:    Date:  02/12/2024   ID:  Tracey Luna, DOB February 23, 1961, MRN [id number]  PCP:  Richmond Campbell., PA-C  Cardiologist:  Little Ishikawa, MD  Electrophysiologist:  None   Referring MD: Richmond Campbell., PA-C   Chief Complaint  Patient presents with   Coronary Artery Disease    History of Present Illness:    Tracey Luna is a 63 y.o. female with a hx of TIA, hypertension, hyperlipidemia, mitral valve prolapse, aortic dilatation, tobacco use, COPD who presents for follow-up.  She was referred by Mady Gemma, PA for evaluation of aortic dilatation, initially seen 07/05/2021.  She previously followed with Dr. Jacinto Halim in cardiology, last seen 05/04/20.  Echocardiogram on 06/16/2019 showed normal LV systolic function, mild LVH, grade 1 diastolic dysfunction, mild MR.  Lexiscan Myoview on 06/28/2019 showed normal perfusion.  CT chest without contrast 12/04/2018 showed ascending aortic dilatation measuring 41 mm.  MRA chest 02/23/2020 showed stable ascending aortic dilatation measuring 41 mm.  She reports intermittent chest pain that she describes as sharp pain that lasts for few seconds and resolves.  Reports occasional shortness of breath.  She had COVID-19 infection in January 2022.  Had COPD exacerbation after this.  Reports occasional lightheadedness with standing, denies any syncope.  Reports intermittent lower extremity edema.  Reports having palpitations occur about once per week, can last for hours.  Smokes 0.5 to 0.75 ppd, has smoked for over 40 years.  Family history includes both her sister and mother have Marfan's and her sister had her thoracic aortic aneurysm repaired.  Her father has had multiple MIs, first in his 65s, and had a pacemaker.  Zio patch x12 days on 08/03/2021 showed 2 episodes of SVT, longest lasting 2 hours 13 minutes with average rate 118 bpm.  Started on Toprol-XL 25 mg daily.  Echocardiogram 10/21/2022 showed normal biventricular  function, no significant valvular disease.  Coronary CTA on 01/09/2023 showed nonobstructive CAD, calcium score 396 (96 percentile), dilated ascending aorta measuring 42 mm.  Since last clinic visit, she reports has been doing okay.  She did not schedule with GI.  She has been working on weight loss, has lost 20 pounds this year.  She still smoking 1 pack/day.  Reports chest pain and dyspnea have improved.  Past Medical History:  Diagnosis Date   Anxiety    Complication of anesthesia    COPD (chronic obstructive pulmonary disease) (HCC)    GERD (gastroesophageal reflux disease)    GERD (gastroesophageal reflux disease) 09/24/2016   Hyperglycemia    Hyperlipidemia    Hypertension    PONV (postoperative nausea and vomiting)    Pre-diabetes    Stroke (HCC)    TIA, no deficits   Varicose veins    Vitamin D deficiency     Past Surgical History:  Procedure Laterality Date   CHOLECYSTECTOMY     EXCISION ORAL TUMOR Right 05/20/2017   Procedure: EXCISION RIGHT BUCCAL MUCOSAL LESION;  Surgeon: Newman Pies, MD;  Location: [facility];  Service: ENT;  Laterality: Right;    Current Medications: Current Meds  Medication Sig   albuterol (VENTOLIN HFA) 108 (90 Base) MCG/ACT inhaler Inhale into the lungs as needed.   amLODipine (NORVASC) 5 MG tablet TAKE 1 TABLET BY MOUTH DAILY   aspirin EC 81 MG tablet Take 1 tablet (81 mg total) by mouth daily. Swallow whole. (Patient taking differently: Take 81 mg by mouth 2 (two) times a week. Swallow whole.)  atorvastatin (LIPITOR) 80 MG tablet TAKE 1 TABLET(80 MG) BY MOUTH DAILY   carvedilol (COREG) 6.25 MG tablet TAKE 1 TABLET(6.25 MG) BY MOUTH TWICE DAILY   Fluticasone-Umeclidin-Vilant (TRELEGY ELLIPTA) 100-62.5-25 MCG/INH AEPB Inhale 1 puff into the lungs daily.   glipiZIDE (GLUCOTROL XL) 2.5 MG 24 hr tablet Take 1 tablet by mouth daily.   LORazepam (ATIVAN) 0.5 MG tablet Take 0.5 mg by mouth as needed.   losartan (COZAAR) 100 MG tablet TAKE  1 TABLET(100 MG) BY MOUTH DAILY   metFORMIN (GLUCOPHAGE-XR) 500 MG 24 hr tablet Take one pill with your largest meal   nystatin cream (MYCOSTATIN) nystatin 100,000 unit/gram topical cream  APP EXT AA BID   omeprazole (PRILOSEC) 40 MG capsule Take 40 mg by mouth daily.   triamcinolone cream (KENALOG) 0.1 % Apply 1 Application topically as needed.     Allergies:   Avelox [moxifloxacin hcl in nacl], Penicillins, and Vancomycin   Social History   Socioeconomic History   Marital status: Married    Spouse name: Not on file   Number of children: 3   Years of education: Not on file   Highest education level: Not on file  Occupational History   Not on file  Tobacco Use   Smoking status: Every Day    Current packs/day: 0.50    Average packs/day: 0.5 packs/day for 30.0 years (15.0 ttl pk-yrs)    Types: Cigarettes   Smokeless tobacco: Never   Tobacco comments:    1/2 pack per day   Vaping Use   Vaping status: Never Used  Substance and Sexual Activity   Alcohol use: Yes    Alcohol/week: 0.0 standard drinks of alcohol    Comment: occ   Drug use: No   Sexual activity: Not on file  Other Topics Concern   Not on file  Social History Narrative   Not on file   Social Drivers of Health   Financial Resource Strain: Unknown (02/11/2021)   [facility] prior to 02/01/2023., [facility] visits prior to 02/01/2023.   Overall Financial Resource Strain (CARDIA)    Difficulty of Paying Living Expenses: Patient refused  Food Insecurity: Low Risk  (05/06/2023)   [facility]   Hunger Vital Sign    Worried About Running Out of Food in the Last Year: Never true    Ran Out of Food in the Last Year: Never true  Transportation Needs: No Transportation Needs (05/06/2023)   [facility]   Transportation    In the past 12 months, has lack of reliable transportation kept you from medical  appointments, meetings, work or from getting things needed for daily living? : No  Physical Activity: Unknown (02/11/2021)   [facility] prior to 02/01/2023., [facility] visits prior to 02/01/2023.   Exercise Vital Sign    Days of Exercise per Week: Patient refused    Minutes of Exercise per Session: Not on file  Stress: No Stress Concern Present (02/11/2021)   [facility] prior to 02/01/2023., [facility] visits prior to 02/01/2023.   [employer] of Occupational Health - Occupational Stress Questionnaire    Feeling of Stress : Not at all  Social Connections: Unknown (02/11/2021)   [facility] visits prior to 02/01/2023., [facility] visits prior  to 02/01/2023.   Social Advertising account executive [NHANES]    Frequency of Communication with Friends and Family: More than three times a week    Frequency of Social Gatherings with Friends and Family: Once a week    Attends Religious Services: Patient refused    Database administrator or Organizations: Patient refused    Attends Engineer, structural: Patient refused    Marital Status: Married     Family History: The patient's family history includes Bleeding Disorder in her mother; Diabetes in her brother; Heart attack in her father; Heart disease in her father, mother, and sister; Hyperlipidemia in her father; Hypertension in her brother, father, and sister; Varicose Veins in her mother.  ROS:   Please see the history of present illness.     All other systems reviewed and are negative.  EKGs/Labs/Other Studies Reviewed:    The following studies were reviewed today:   EKG:   10/15/22: normal sinus rhythm, rate 69, no ST/T abnormality 08/07/23: NSR, rate 83, no ST abnormality 02/12/2024: Normal sinus rhythm, rate 69, no ST abnormalities  Recent  Labs: No results found for requested labs within last 365 days.  Recent Lipid Panel    Component Value Date/Time   CHOL 178 10/15/2022 1043   TRIG 158 (H) 10/15/2022 1043   HDL 37 (L) 10/15/2022 1043   CHOLHDL 4.8 (H) 10/15/2022 1043   LDLCALC 113 (H) 10/15/2022 1043    Physical Exam:    VS:  BP 124/80   Pulse 68   Ht 5\' 4"  (1.626 m)   Wt 182 lb 3.2 oz (82.6 kg)   SpO2 96%   BMI 31.27 kg/m     Wt Readings from Last 3 Encounters:  02/12/24 182 lb 3.2 oz (82.6 kg)  08/07/23 202 lb 12.8 oz (92 kg)  01/27/23 196 lb 3.2 oz (89 kg)     GEN:  Well nourished, well developed in no acute distress HEENT: Normal NECK: No JVD; No carotid bruits CARDIAC: RRR, 2/6 systolic murmur RESPIRATORY:  Clear to auscultation without rales, wheezing or rhonchi  ABDOMEN: Soft, non-tender, non-distended MUSCULOSKELETAL:  No edema; No deformity  SKIN: Warm and dry NEUROLOGIC:  Alert and oriented x 3 PSYCHIATRIC:  Normal affect   ASSESSMENT:    1. Coronary artery disease involving native coronary artery of native heart without angina pectoris   2. Ascending aorta dilatation (HCC)   3. Primary hypertension   4. Hyperlipidemia, unspecified hyperlipidemia type   5. Tobacco use   6. SVT (supraventricular tachycardia) (HCC)   7. Hypomagnesemia      PLAN:    CAD: Reported atypical chest pain.  Coronary CTA on 01/09/2023 showed nonobstructive CAD, calcium score 396 (96 percentile), dilated ascending aorta measuring 42 mm.  Echocardiogram 10/21/2022 showed normal biventricular function, no significant valvular disease.  -Continue aspirin 81 mg daily -Continue atorvastatin 80 mg daily  Aortic dilatation: Measured 41 mm on MRA chest 02/23/2020.  She reports she is not able to tolerate MRIs due to claustrophobia.  CTA chest 10/08/2022 showed stable 41 mm ascending aorta.  Coronary CTA 01/2023 showed 42 mm ascending aorta -Recommend echocardiogram to monitor  Hypertension: On losartan 100 mg daily,  carvedilol 6.25 mg twice daily, amlodipine 5 mg daily.  Previously on HCTZ but discontinued due to hypomagnesemia.  BP appears controlled  Hyperlipidemia: LDL 113 on 10/15/2022, on atorvastatin 40 mg daily.  Atorvastatin increased to 80 mg daily 01/2023 given coronary CTA results as above.  LDL 109  04/2023, Zetia 10 mg daily added.  LDL 64 on 01/2024  Tobacco use: Patient counseled on the risk of tobacco use and cessation strongly encouraged.  She has nicotine patches, but has not started using.  Encouraged to try nicotine patches  SVT: Zio patch x12 days on 08/03/2021 showed 2 episodes of SVT, longest lasting 2 hours 13 minutes with average rate 118 bpm.   -Continue carvedilol  OSA: Positive sleep study 01/2023, CPAP recommended but she did not start.  Referred to sleep medicine  T2DM: On metformin.  A1c 7.0% 07/2023, improved to 6.0% with her recent weight loss  Hypomagnesemia: Magnesium 1.4 07/2023.  Suspect was related to HCTZ use but she discontinued HCTZ and her magnesium remains low.  She does report has issues with chronic diarrhea, suspect this is contributing.  Referred to GI.    RTC in 1 year   Medication Adjustments/Labs and Tests Ordered: Current medicines are reviewed at length with the patient today.  Concerns regarding medicines are outlined above.  Orders Placed This Encounter  Procedures   EKG 12-Lead   ECHOCARDIOGRAM COMPLETE    No orders of the defined types were placed in this encounter.    Patient Instructions  Medication Instructions:   NO CHANGE  *If you need a refill on your cardiac medications before your next appointment, please call your pharmacy*   Lab Work:  NONE NEEDED  If you have labs (blood work) drawn today and your tests are completely normal, you will receive your results only by: MyChart Message (if you have MyChart) OR A paper copy in the mail If you have any lab test that is abnormal or we need to change your treatment, we will call you to  review the results.   Testing/Procedures:  Your physician has requested that you have an echocardiogram. Echocardiography is a painless test that uses sound waves to create images of your heart. It provides your doctor with information about the size and shape of your heart and how well your heart's chambers and valves are working. This procedure takes approximately one hour. There are no restrictions for this procedure. Please do NOT wear cologne, perfume, aftershave, or lotions (deodorant is allowed). Please arrive 15 minutes prior to your appointment time.  Please note: We ask at that you not bring children with you during ultrasound (echo/ vascular) testing. Due to room size and safety concerns, children are not allowed in the ultrasound rooms during exams. Our front office staff cannot provide observation of children in our lobby area while testing is being conducted. An adult accompanying a patient to their appointment will only be allowed in the ultrasound room at the discretion of the ultrasound technician under special circumstances. We apologize for any inconvenience. [address]   Follow-Up: At [facility], you and your health needs are our priority.  As part of our continuing mission to provide you with exceptional heart care, we have created designated Provider Care Teams.  These Care Teams include your primary Cardiologist (physician) and Advanced Practice Providers (APPs -  Physician Assistants and Nurse Practitioners) who all work together to provide you with the care you need, when you need it.  We recommend signing up for the patient portal called "MyChart".  Sign up information is provided on this After Visit Summary.  MyChart is used to connect with patients for Virtual Visits (Telemedicine).  Patients are able to view lab/test results, encounter notes, upcoming appointments, etc.  Non-urgent messages can be sent to  your provider as well.   To learn more  about what you can do with MyChart, go to [email or web address].    Your next appointment:   12 month(s)  Provider:   Little Ishikawa, MD            Signed, Little Ishikawa, MD  02/12/2024 5:52 PM    [facility]

## 2024-02-13 NOTE — Telephone Encounter (Signed)
 Reviewed labs with patient during her appointment

## 2024-02-19 ENCOUNTER — Other Ambulatory Visit (HOSPITAL_COMMUNITY): Payer: Self-pay | Admitting: Family Medicine

## 2024-02-19 DIAGNOSIS — F17210 Nicotine dependence, cigarettes, uncomplicated: Secondary | ICD-10-CM

## 2024-02-19 DIAGNOSIS — F17200 Nicotine dependence, unspecified, uncomplicated: Secondary | ICD-10-CM

## 2024-03-02 ENCOUNTER — Other Ambulatory Visit (HOSPITAL_COMMUNITY): Payer: Self-pay | Admitting: Family Medicine

## 2024-03-02 DIAGNOSIS — Z1231 Encounter for screening mammogram for malignant neoplasm of breast: Secondary | ICD-10-CM

## 2024-03-05 ENCOUNTER — Ambulatory Visit (HOSPITAL_COMMUNITY)

## 2024-03-15 ENCOUNTER — Ambulatory Visit (HOSPITAL_COMMUNITY)

## 2024-03-19 ENCOUNTER — Ambulatory Visit (HOSPITAL_COMMUNITY): Attending: Cardiovascular Disease

## 2024-03-19 DIAGNOSIS — I10 Essential (primary) hypertension: Secondary | ICD-10-CM | POA: Diagnosis not present

## 2024-03-19 DIAGNOSIS — I77810 Thoracic aortic ectasia: Secondary | ICD-10-CM | POA: Insufficient documentation

## 2024-03-19 LAB — ECHOCARDIOGRAM COMPLETE
Area-P 1/2: 3.48 cm2
S' Lateral: 2.3 cm

## 2024-03-24 ENCOUNTER — Encounter: Payer: Self-pay | Admitting: *Deleted

## 2024-04-12 ENCOUNTER — Encounter (HOSPITAL_COMMUNITY): Payer: Self-pay

## 2024-04-12 ENCOUNTER — Ambulatory Visit (HOSPITAL_COMMUNITY)
Admission: RE | Admit: 2024-04-12 | Discharge: 2024-04-12 | Disposition: A | Discharge status: Home / Self Care | Source: Ambulatory Visit | Attending: Family Medicine | Admitting: Family Medicine

## 2024-04-12 DIAGNOSIS — Z1231 Encounter for screening mammogram for malignant neoplasm of breast: Secondary | ICD-10-CM | POA: Diagnosis present

## 2024-04-12 DIAGNOSIS — F17210 Nicotine dependence, cigarettes, uncomplicated: Secondary | ICD-10-CM | POA: Insufficient documentation

## 2024-04-12 DIAGNOSIS — F17200 Nicotine dependence, unspecified, uncomplicated: Secondary | ICD-10-CM | POA: Diagnosis present

## 2024-05-31 ENCOUNTER — Other Ambulatory Visit (HOSPITAL_COMMUNITY): Payer: Self-pay | Admitting: Orthopedic Surgery

## 2024-05-31 DIAGNOSIS — G8929 Other chronic pain: Secondary | ICD-10-CM

## 2024-06-03 ENCOUNTER — Ambulatory Visit (HOSPITAL_COMMUNITY)
Admission: RE | Admit: 2024-06-03 | Discharge: 2024-06-03 | Disposition: A | Discharge status: Home / Self Care | Source: Ambulatory Visit | Attending: Orthopedic Surgery | Admitting: Orthopedic Surgery

## 2024-06-03 DIAGNOSIS — G8929 Other chronic pain: Secondary | ICD-10-CM | POA: Diagnosis not present

## 2024-06-03 DIAGNOSIS — M25562 Pain in left knee: Secondary | ICD-10-CM | POA: Diagnosis present

## 2024-06-06 ENCOUNTER — Other Ambulatory Visit: Payer: Self-pay | Admitting: Cardiology

## 2024-06-08 ENCOUNTER — Encounter (HOSPITAL_COMMUNITY)

## 2024-07-23 ENCOUNTER — Encounter: Payer: Self-pay | Admitting: Radiology

## 2024-07-26 ENCOUNTER — Other Ambulatory Visit (HOSPITAL_COMMUNITY): Payer: Self-pay | Admitting: Orthopedic Surgery

## 2024-07-26 DIAGNOSIS — M25561 Pain in right knee: Secondary | ICD-10-CM

## 2024-07-26 DIAGNOSIS — M2351 Chronic instability of knee, right knee: Secondary | ICD-10-CM

## 2024-07-30 ENCOUNTER — Ambulatory Visit (HOSPITAL_COMMUNITY)
Admission: RE | Admit: 2024-07-30 | Discharge: 2024-07-30 | Disposition: A | Discharge status: Home / Self Care | Source: Ambulatory Visit | Attending: Orthopedic Surgery | Admitting: Orthopedic Surgery

## 2024-07-30 DIAGNOSIS — M25561 Pain in right knee: Secondary | ICD-10-CM | POA: Diagnosis present

## 2024-07-30 DIAGNOSIS — M2351 Chronic instability of knee, right knee: Secondary | ICD-10-CM | POA: Diagnosis present

## 2024-09-19 ENCOUNTER — Other Ambulatory Visit: Payer: Self-pay | Admitting: Cardiology

## 2024-10-04 ENCOUNTER — Encounter: Payer: Self-pay | Admitting: Radiology

## 2024-12-31 ENCOUNTER — Other Ambulatory Visit: Payer: Self-pay | Admitting: Cardiology

## 2025-01-03 ENCOUNTER — Other Ambulatory Visit: Payer: Self-pay | Admitting: Cardiology

## 2025-01-04 MED ORDER — LOSARTAN POTASSIUM 100 MG PO TABS: 100.0000 mg | ORAL_TABLET | Freq: Every day | ORAL | 0 refills | Status: AC

## 2025-01-05 MED ORDER — ATORVASTATIN CALCIUM 80 MG PO TABS: 80.0000 mg | ORAL_TABLET | Freq: Every day | ORAL | 1 refills | Status: AC

## 2025-03-29 ENCOUNTER — Ambulatory Visit (HOSPITAL_BASED_OUTPATIENT_CLINIC_OR_DEPARTMENT_OTHER): Admitting: Cardiology
